# Patient Record
Sex: Male | Born: 1980 | Race: White | Hispanic: No | Marital: Married | Smoking: Never smoker
Health system: Southern US, Community
[De-identification: ages and names within clinical notes are randomized; demographics above are authoritative.]

## PROBLEM LIST (undated history)

## (undated) DIAGNOSIS — F419 Anxiety disorder, unspecified: Secondary | ICD-10-CM

## (undated) DIAGNOSIS — F329 Major depressive disorder, single episode, unspecified: Secondary | ICD-10-CM

## (undated) DIAGNOSIS — F32A Depression, unspecified: Secondary | ICD-10-CM

## (undated) DIAGNOSIS — N200 Calculus of kidney: Secondary | ICD-10-CM

## (undated) DIAGNOSIS — T7840XA Allergy, unspecified, initial encounter: Secondary | ICD-10-CM

## (undated) HISTORY — DX: Anxiety disorder, unspecified: F41.9

## (undated) HISTORY — DX: Allergy, unspecified, initial encounter: T78.40XA

## (undated) HISTORY — PX: VASECTOMY: SHX75

## (undated) HISTORY — DX: Calculus of kidney: N20.0

---

## 2001-10-25 ENCOUNTER — Encounter: Payer: Self-pay | Admitting: Family Medicine

## 2001-10-25 ENCOUNTER — Encounter: Admission: RE | Admit: 2001-10-25 | Discharge: 2001-10-25 | Payer: Self-pay | Admitting: Family Medicine

## 2006-12-03 ENCOUNTER — Encounter: Admission: RE | Admit: 2006-12-03 | Discharge: 2006-12-20 | Payer: Self-pay | Admitting: Family Medicine

## 2012-04-12 ENCOUNTER — Emergency Department: Payer: Self-pay | Admitting: Emergency Medicine

## 2012-04-12 LAB — TROPONIN I
Troponin-I: <0.02 ng/mL
Troponin-I: <0.02 ng/mL

## 2012-04-12 LAB — COMPREHENSIVE METABOLIC PANEL
Albumin: 4.5 g/dL (ref 3.4–5.0)
Alkaline Phosphatase: 79 U/L (ref 50–136)
BUN: 9 mg/dL (ref 7–18)
Chloride: 106 mmol/L (ref 98–107)
Co2: 31 mmol/L (ref 21–32)
Creatinine: 0.9 mg/dL (ref 0.60–1.30)
EGFR (African American): >60
SGOT(AST): 31 U/L (ref 15–37)

## 2012-04-12 LAB — CBC
HCT: 43.4 % (ref 40.0–52.0)
HGB: 15.2 g/dL (ref 13.0–18.0)
Platelet: 188 10*3/uL (ref 150–440)
RDW: 13.5 % (ref 11.5–14.5)

## 2012-04-15 ENCOUNTER — Telehealth: Payer: Self-pay | Admitting: Nurse Practitioner

## 2012-04-15 NOTE — Telephone Encounter (Signed)
Went to ER over the weekend for chest pain and wanted him to follow up for chest pain appt made

## 2012-04-16 ENCOUNTER — Encounter: Payer: Self-pay | Admitting: Nurse Practitioner

## 2012-04-16 ENCOUNTER — Ambulatory Visit (INDEPENDENT_AMBULATORY_CARE_PROVIDER_SITE_OTHER): Payer: BC Managed Care – PPO | Admitting: Nurse Practitioner

## 2012-04-16 VITALS — BP 116/68 | HR 68 | Temp 98.0°F | Ht 69.5 in | Wt 191.0 lb

## 2012-04-16 DIAGNOSIS — F410 Panic disorder [episodic paroxysmal anxiety] without agoraphobia: Secondary | ICD-10-CM

## 2012-04-16 MED ORDER — CITALOPRAM HYDROBROMIDE 20 MG PO TABS: 20.0000 mg | ORAL_TABLET | Freq: Every day | ORAL | Status: DC

## 2012-04-16 NOTE — Progress Notes (Signed)
  Subjective:    Patient ID: Bryan Wilson, male    DOB: [birth year], 32 y.o.   MRN: [id number]  HPIPatient in today for hospital follow-up. Went to ER with SOB and chest pain. Everything was negative including EKG, blood work and Veterinary surgeon. Patient says he gets anxious at work and that is where he was when episode started.    Review of Systems  Constitutional: Negative.   HENT: Negative.   Eyes: Negative.   Respiratory: Negative.   Cardiovascular: Negative.   Gastrointestinal: Negative.   Endocrine: Negative.   Genitourinary: Negative.   Musculoskeletal: Negative.   Neurological: Negative.   Hematological: Negative.   Psychiatric/Behavioral: Negative.        Objective:   Physical Exam  Constitutional: He is oriented to person, place, and time. He appears well-developed and well-nourished.  Cardiovascular: Normal rate, regular rhythm, normal heart sounds and intact distal pulses.   Pulmonary/Chest: Effort normal and breath sounds normal.  Neurological: He is alert and oriented to person, place, and time. He has normal reflexes.  Skin: Skin is warm and dry.  Psychiatric: He has a normal mood and affect. His behavior is normal. Judgment and thought content normal.  BP 116/68  Pulse 68  Temp(Src) 98 F (36.7 C) (Oral)  Ht 5' 9.5" (1.765 m)  Wt 191 lb (86.637 kg)  BMI 27.81 kg/m2         Assessment & Plan:  Panic attacks  Stress management  Eat 3 good meals a day  Celexa as rx- SE reviewed  Mary-Margaret Daphine Deutscher, FNP

## 2012-04-16 NOTE — Patient Instructions (Signed)
Stress Stress-related medical problems are becoming increasingly common. The body has a built-in physical response to stressful situations. Faced with pressure, challenge or danger, we need to react quickly. Our bodies release hormones such as cortisol and adrenaline to help do this. These hormones are part of the "fight or flight" response and affect the metabolic rate, heart rate and blood pressure, resulting in a heightened, stressed state that prepares the body for optimum performance in dealing with a stressful situation. It is likely that early man required these mechanisms to stay alive, but usually modern stresses do not call for this, and the same hormones released in today's world can damage health and reduce coping ability. CAUSES  Pressure to perform at work, at school or in sports.  Threats of physical violence.  Money worries.  Arguments.  Family conflicts.  Divorce or separation from significant other.  Bereavement.  New job or unemployment.  Changes in location.  Alcohol or drug abuse. SOMETIMES, THERE IS NO PARTICULAR REASON FOR DEVELOPING STRESS. Almost all people are at risk of being stressed at some time in their lives. It is important to know that some stress is temporary and some is long term.  Temporary stress will go away when a situation is resolved. Most people can cope with short periods of stress, and it can often be relieved by relaxing, taking a walk, chatting through issues with friends, or having a good night's sleep.  Chronic (long-term, continuous) stress is much harder to deal with. It can be psychologically and emotionally damaging. It can be harmful both for an individual and for friends and family. SYMPTOMS Everyone reacts to stress differently. There are some common effects that help us recognize it. In times of extreme stress, people may:  Shake uncontrollably.  Breathe faster and deeper than normal (hyperventilate).  Vomit.  For people  with asthma, stress can trigger an attack.  For some people, stress may trigger migraine headaches, ulcers, and body pain. PHYSICAL EFFECTS OF STRESS MAY INCLUDE:  Loss of energy.  Skin problems.  Aches and pains resulting from tense muscles, including neck ache, backache and tension headaches.  Increased pain from arthritis and other conditions.  Irregular heart beat (palpitations).  Periods of irritability or anger.  Apathy or depression.  Anxiety (feeling uptight or worrying).  Unusual behavior.  Loss of appetite.  Comfort eating.  Lack of concentration.  Loss of, or decreased, sex-drive.  Increased smoking, drinking, or recreational drug use.  For women, missed periods.  Ulcers, joint pain, and muscle pain. Post-traumatic stress is the stress caused by any serious accident, strong emotional damage, or extremely difficult or violent experience such as rape or war. Post-traumatic stress victims can experience mixtures of emotions such as fear, shame, depression, guilt or anger. It may include recurrent memories or images that may be haunting. These feelings can last for weeks, months or even years after the traumatic event that triggered them. Specialized treatment, possibly with medicines and psychological therapies, is available. If stress is causing physical symptoms, severe distress or making it difficult for you to function as normal, it is worth seeing your caregiver. It is important to remember that although stress is a usual part of life, extreme or prolonged stress can lead to other illnesses that will need treatment. It is better to visit a doctor sooner rather than later. Stress has been linked to the development of high blood pressure and heart disease, as well as insomnia and depression. There is no diagnostic test for   stress since everyone reacts to it differently. But a caregiver will be able to spot the physical symptoms, such  as:  Headaches.  Shingles.  Ulcers. Emotional distress such as intense worry, low mood or irritability should be detected when the doctor asks pertinent questions to identify any underlying problems that might be the cause. In case there are physical reasons for the symptoms, the doctor may also want to do some tests to exclude certain conditions. If you feel that you are suffering from stress, try to identify the aspects of your life that are causing it. Sometimes you may not be able to change or avoid them, but even a small change can have a positive ripple effect. A simple lifestyle change can make all the difference. STRATEGIES THAT CAN HELP DEAL WITH STRESS:  Delegating or sharing responsibilities.  Avoiding confrontations.  Learning to be more assertive.  Regular exercise.  Avoid using alcohol or street drugs to cope.  Eating a healthy, balanced diet, rich in fruit and vegetables and proteins.  Finding humor or absurdity in stressful situations.  Never taking on more than you know you can handle comfortably.  Organizing your time better to get as much done as possible.  Talking to friends or family and sharing your thoughts and fears.  Listening to music or relaxation tapes.  Tensing and then relaxing your muscles, starting at the toes and working up to the head and neck. If you think that you would benefit from help, either in identifying the things that are causing your stress or in learning techniques to help you relax, see a caregiver who is capable of helping you with this. Rather than relying on medications, it is usually better to try and identify the things in your life that are causing stress and try to deal with them. There are many techniques of managing stress including counseling, psychotherapy, aromatherapy, yoga, and exercise. Your caregiver can help you determine what is best for you. Document Released: 03/18/2002 Document Revised: 03/20/2011 Document  Reviewed: 02/12/2007 ExitCare Patient Information 2013 [employer].  

## 2012-05-08 ENCOUNTER — Encounter: Payer: Self-pay | Admitting: *Deleted

## 2012-05-13 ENCOUNTER — Ambulatory Visit (INDEPENDENT_AMBULATORY_CARE_PROVIDER_SITE_OTHER): Payer: BC Managed Care – PPO | Admitting: Nurse Practitioner

## 2012-05-13 ENCOUNTER — Encounter: Payer: Self-pay | Admitting: Nurse Practitioner

## 2012-05-13 VITALS — BP 115/74 | HR 58 | Temp 98.0°F | Wt 183.0 lb

## 2012-05-13 DIAGNOSIS — F329 Major depressive disorder, single episode, unspecified: Secondary | ICD-10-CM

## 2012-05-13 DIAGNOSIS — F3289 Other specified depressive episodes: Secondary | ICD-10-CM

## 2012-05-13 DIAGNOSIS — F32A Depression, unspecified: Secondary | ICD-10-CM

## 2012-05-13 NOTE — Progress Notes (Signed)
  Subjective:    Patient ID: Bryan Wilson, male    DOB: 1980/07/17, 32 y.o.   MRN: [id number]  HPI-Patient here today for follow-up of Celexa- Started 3 weeks ago - Patient says it has helped a lot in keeping him calm and from getting stressed out. Able to stay focused better. Patient denies sexual side effects.     Review of Systems  All other systems reviewed and are negative.       Objective:   Physical Exam  Constitutional: He appears well-developed and well-nourished.  Cardiovascular: Normal rate and normal heart sounds.   Pulmonary/Chest: Effort normal.  Neurological: He is alert.  Skin: Skin is warm.  Psychiatric: He has a normal mood and affect. His behavior is normal. Judgment and thought content normal.    BP 115/74  Pulse 58  Temp(Src) 98 F (36.7 C) (Oral)  Wt 183 lb (83.008 kg)  BMI 26.65 kg/m2       Assessment & Plan:  Depression/anxiety  Continue celexa as RX  Stress management F/U in 6 months  Mary-Margaret Daphine Deutscher, FNP

## 2012-05-13 NOTE — Patient Instructions (Signed)
Anxiety and Panic Attacks  Your caregiver has informed you that you are having an anxiety or panic attack. There may be many forms of this. Most of the time these attacks come suddenly and without warning. They come at any time of day, including periods of sleep, and at any time of life. They may be strong and unexplained. Although panic attacks are very scary, they are physically harmless. Sometimes the cause of your anxiety is not known. Anxiety is a protective mechanism of the body in its fight or flight mechanism. Most of these perceived danger situations are actually nonphysical situations (such as anxiety over losing a job).  CAUSES    The causes of an anxiety or panic attack are many. Panic attacks may occur in otherwise healthy people given a certain set of circumstances. There may be a genetic cause for panic attacks. Some medications may also have anxiety as a side effect.  SYMPTOMS    Some of the most common feelings are:   Intense terror.   Dizziness, feeling faint.   Hot and cold flashes.   Fear of going crazy.   Feelings that nothing is real.   Sweating.   Shaking.   Chest pain or a fast heartbeat (palpitations).   Smothering, choking sensations.   Feelings of impending doom and that death is near.   Tingling of extremities, this may be from over-breathing.   Altered reality (derealization).   Being detached from yourself (depersonalization).  Several symptoms can be present to make up anxiety or panic attacks.  DIAGNOSIS    The evaluation by your caregiver will depend on the type of symptoms you are experiencing. The diagnosis of anxiety or panic attack is made when no physical illness can be determined to be a cause of the symptoms.  TREATMENT    Treatment to prevent anxiety and panic attacks may include:   Avoidance of circumstances that cause anxiety.   Reassurance and relaxation.   Regular exercise.   Relaxation therapies, such as yoga.    Psychotherapy with a psychiatrist or therapist.   Avoidance of caffeine, alcohol and illegal drugs.   Prescribed medication.  SEEK IMMEDIATE MEDICAL CARE IF:     You experience panic attack symptoms that are different than your usual symptoms.   You have any worsening or concerning symptoms.  Document Released: 12/26/2004 Document Revised: 03/20/2011 Document Reviewed: 04/29/2009  ExitCare Patient Information 2013 [employer].    Depression, Adult  Depression refers to feeling sad, low, down in the dumps, blue, gloomy, or empty. In general, there are two kinds of depression:  1. Depression that we all experience from time to time because of upsetting life experiences, including the loss of a job or the ending of a relationship (normal sadness or normal grief). This kind of depression is considered normal, is short lived, and resolves within a few days to 2 weeks. (Depression experienced after the loss of a loved one is called bereavement. Bereavement often lasts longer than 2 weeks but normally gets better with time.)  2. Clinical depression, which lasts longer than normal sadness or normal grief or interferes with your ability to function at home, at work, and in school. It also interferes with your personal relationships. It affects almost every aspect of your life. Clinical depression is an illness.  Symptoms of depression also can be caused by conditions other than normal sadness and grief or clinical depression. Examples of these conditions are listed as follows:   Physical illness Some physical   illnesses, including underactive thyroid gland (hypothyroidism), severe anemia, specific types of cancer, diabetes, uncontrolled seizures, heart and lung problems, strokes, and chronic pain are commonly associated with symptoms of depression.   Side effects of some prescription medicine In some people, certain types of prescription medicine can cause symptoms of depression.    Substance abuse Abuse of alcohol and illicit drugs can cause symptoms of depression.  SYMPTOMS  Symptoms of normal sadness and normal grief include the following:   Feeling sad or crying for short periods of time.   Not caring about anything (apathy).   Difficulty sleeping or sleeping too much.   No longer able to enjoy the things you used to enjoy.   Desire to be by oneself all the time (social isolation).   Lack of energy or motivation.   Difficulty concentrating or remembering.   Change in appetite or weight.   Restlessness or agitation.  Symptoms of clinical depression include the same symptoms of normal sadness or normal grief and also the following symptoms:   Feeling sad or crying all the time.   Feelings of guilt or worthlessness.   Feelings of hopelessness or helplessness.   Thoughts of suicide or the desire to harm yourself (suicidal ideation).   Loss of touch with reality (psychotic symptoms). Seeing or hearing things that are not real (hallucinations) or having false beliefs about your life or the people around you (delusions and paranoia).  DIAGNOSIS    The diagnosis of clinical depression usually is based on the severity and duration of the symptoms. Your caregiver also will ask you questions about your medical history and substance use to find out if physical illness, use of prescription medicine, or substance abuse is causing your depression. Your caregiver also may order blood tests.  TREATMENT    Typically, normal sadness and normal grief do not require treatment. However, sometimes antidepressant medicine is prescribed for bereavement to ease the depressive symptoms until they resolve.  The treatment for clinical depression depends on the severity of your symptoms but typically includes antidepressant medicine, counseling with a mental health professional, or a combination of both. Your caregiver will help to determine what treatment is best for you.   Depression caused by physical illness usually goes away with appropriate medical treatment of the illness. If prescription medicine is causing depression, talk with your caregiver about stopping the medicine, decreasing the dose, or substituting another medicine.  Depression caused by abuse of alcohol or illicit drugs abuse goes away with abstinence from these substances. Some adults need professional help in order to stop drinking or using drugs.  SEEK IMMEDIATE CARE IF:   You have thoughts about hurting yourself or others.   You lose touch with reality (have psychotic symptoms).   You are taking medicine for depression and have a serious side effect.  FOR MORE INFORMATION  National Alliance on Mental Illness: www.nami.org   National Institute of Mental Health: www.nimh.nih.gov   Document Released: 12/24/1999 Document Revised: 06/27/2011 Document Reviewed: 03/27/2011  [employer] Patient Information 2013 [employer].

## 2012-05-25 ENCOUNTER — Telehealth: Payer: Self-pay | Admitting: *Deleted

## 2012-05-25 NOTE — Telephone Encounter (Signed)
  Husband has been out on vacation and returning to work today.  His wife is concerned that he seems frustrated and anxious.  He has not been sleeping well and says he has had some crazy thoughts and feels depressed.  She is unsure if the celexa is working well enough.  Should  celexa be increased or should we consider a change? Please call on Monday to advise her.

## 2012-05-27 MED ORDER — CITALOPRAM HYDROBROMIDE 40 MG PO TABS: 40.0000 mg | ORAL_TABLET | Freq: Every day | ORAL | Status: DC

## 2012-05-27 NOTE — Telephone Encounter (Signed)
Pt given appt for 5/20 at 8:00 with mmm and aware to stop the celexa.

## 2012-05-27 NOTE — Telephone Encounter (Signed)
Pt is having some suicidal thoughts and has gotten worse since starting the medicine.

## 2012-05-27 NOTE — Telephone Encounter (Signed)
Increased celexa to 40mg  rx was sent to pharmacy

## 2012-05-27 NOTE — Telephone Encounter (Signed)
Patient really NTBS to discuss- Stop Celexa!!!

## 2012-05-28 ENCOUNTER — Ambulatory Visit (INDEPENDENT_AMBULATORY_CARE_PROVIDER_SITE_OTHER): Payer: BC Managed Care – PPO | Admitting: Nurse Practitioner

## 2012-05-28 ENCOUNTER — Encounter: Payer: Self-pay | Admitting: Nurse Practitioner

## 2012-05-28 VITALS — BP 113/68 | HR 62 | Temp 98.0°F | Ht 69.0 in | Wt 191.0 lb

## 2012-05-28 DIAGNOSIS — F329 Major depressive disorder, single episode, unspecified: Secondary | ICD-10-CM

## 2012-05-28 DIAGNOSIS — F32A Depression, unspecified: Secondary | ICD-10-CM

## 2012-05-28 MED ORDER — VILAZODONE HCL 40 MG PO TABS: 40.0000 mg | ORAL_TABLET | Freq: Every day | ORAL | Status: DC

## 2012-05-28 NOTE — Patient Instructions (Signed)

## 2012-05-28 NOTE — Progress Notes (Signed)
  Subjective:    Patient ID: Bryan Wilson, male    DOB: [birth year], 32 y.o.   MRN: [id number]  HPI- Patient was put on Celexa about 1 month ago- Helped some but started causing suicidal thoughts. Patient was on vacation for a week and just went back to worse and anxiety was worse. Patient hasn't taken celexa since Saturday.    Review of Systems  All other systems reviewed and are negative.       Objective:   Physical Exam  Constitutional: He appears well-developed and well-nourished.  Cardiovascular: Normal rate and normal heart sounds.   Pulmonary/Chest: Effort normal and breath sounds normal.  Skin: Skin is warm.  Psychiatric: He has a normal mood and affect. His behavior is normal. Judgment and thought content normal.    BP 113/68  Pulse 62  Temp(Src) 98 F (36.7 C) (Oral)  Ht 5\' 9"  (1.753 m)  Wt 191 lb (86.637 kg)  BMI 28.19 kg/m2       Assessment & Plan:  1. Depression Stress management exercise - Vilazodone HCl (VIIBRYD) 40 MG TABS; Take 1 tablet (40 mg total) by mouth daily.  Dispense: 30 tablet; Refill: 0  Mary-Margaret Daphine Deutscher, FNP

## 2012-06-17 ENCOUNTER — Telehealth: Payer: Self-pay | Admitting: Nurse Practitioner

## 2012-06-17 ENCOUNTER — Encounter (HOSPITAL_COMMUNITY): Payer: Self-pay

## 2012-06-17 ENCOUNTER — Ambulatory Visit (INDEPENDENT_AMBULATORY_CARE_PROVIDER_SITE_OTHER): Payer: BC Managed Care – PPO | Admitting: Nurse Practitioner

## 2012-06-17 ENCOUNTER — Emergency Department (HOSPITAL_COMMUNITY)
Admission: EM | Admit: 2012-06-17 | Discharge: 2012-06-17 | Disposition: A | Discharge status: Home / Self Care | Payer: BC Managed Care – PPO | Attending: Emergency Medicine | Admitting: Emergency Medicine

## 2012-06-17 ENCOUNTER — Encounter: Payer: Self-pay | Admitting: Nurse Practitioner

## 2012-06-17 VITALS — BP 117/65 | HR 72 | Temp 97.8°F

## 2012-06-17 DIAGNOSIS — F3289 Other specified depressive episodes: Secondary | ICD-10-CM | POA: Insufficient documentation

## 2012-06-17 DIAGNOSIS — Z79899 Other long term (current) drug therapy: Secondary | ICD-10-CM | POA: Insufficient documentation

## 2012-06-17 DIAGNOSIS — F329 Major depressive disorder, single episode, unspecified: Secondary | ICD-10-CM | POA: Insufficient documentation

## 2012-06-17 DIAGNOSIS — F32A Depression, unspecified: Secondary | ICD-10-CM

## 2012-06-17 DIAGNOSIS — F411 Generalized anxiety disorder: Secondary | ICD-10-CM | POA: Insufficient documentation

## 2012-06-17 DIAGNOSIS — R45851 Suicidal ideations: Secondary | ICD-10-CM

## 2012-06-17 HISTORY — DX: Major depressive disorder, single episode, unspecified: F32.9

## 2012-06-17 HISTORY — DX: Depression, unspecified: F32.A

## 2012-06-17 LAB — BASIC METABOLIC PANEL
CO2: 32 mEq/L (ref 19–32)
Calcium: 9.9 mg/dL (ref 8.4–10.5)
Creatinine, Ser: 0.99 mg/dL (ref 0.50–1.35)
GFR calc Af Amer: >90 mL/min (ref >90)
GFR calc non Af Amer: >90 mL/min (ref >90)
Sodium: 144 mEq/L (ref 135–145)

## 2012-06-17 LAB — RAPID URINE DRUG SCREEN, HOSP PERFORMED
Amphetamines: NOT DETECTED
Opiates: NOT DETECTED

## 2012-06-17 LAB — CBC WITH DIFFERENTIAL/PLATELET
Basophils Absolute: 0 10*3/uL (ref 0.0–0.1)
Basophils Relative: 0 % (ref 0–1)
Eosinophils Relative: 3 % (ref 0–5)
Lymphocytes Relative: 22 % (ref 12–46)
MCHC: 35.1 g/dL (ref 30.0–36.0)
MCV: 83.1 fL (ref 78.0–100.0)
Platelets: 212 10*3/uL (ref 150–400)
RDW: 13.4 % (ref 11.5–15.5)
WBC: 4.8 10*3/uL (ref 4.0–10.5)

## 2012-06-17 LAB — ETHANOL: Alcohol, Ethyl (B): <11 mg/dL (ref 0–11)

## 2012-06-17 MED ORDER — ONDANSETRON HCL 4 MG PO TABS: 4.0000 mg | ORAL_TABLET | Freq: Three times a day (TID) | ORAL | Status: DC | PRN

## 2012-06-17 MED ORDER — HYDROXYZINE HCL 25 MG PO TABS: 25.0000 mg | ORAL_TABLET | Freq: Two times a day (BID) | ORAL | Status: DC | PRN

## 2012-06-17 MED ORDER — BUPROPION HCL 100 MG PO TABS: 100.0000 mg | ORAL_TABLET | Freq: Every morning | ORAL | Status: DC

## 2012-06-17 MED ORDER — ACETAMINOPHEN 325 MG PO TABS: 650.0000 mg | ORAL_TABLET | ORAL | Status: DC | PRN

## 2012-06-17 NOTE — Telephone Encounter (Signed)
appt made for this am with mmm

## 2012-06-17 NOTE — ED Notes (Signed)
Pt undressed self, belongings bagged and placed into lock-up.  Pt re-wanded by security.  Pt has male visitor at bedside.

## 2012-06-17 NOTE — Progress Notes (Signed)
  Subjective:    Patient ID: Bryan Wilson, male    DOB: 13-Apr-1980, 32 y.o.   MRN: [id number]  HPI  Patient was seen 2 weeks ago with depression and anxiety- Was started on viibyrd- patient says he is unchanged- Only difference he can tell is that he is sleeping better. Tried celexa prior to viibyrd which did nothing. Issues seem to be mainly at work. Patient says that he is having suicidal thoughts- Patient said that if he would have had a gun in the car this morning he would have shot hisself- said he was on his way to work when he started feeling this way.    Review of Systems  Psychiatric/Behavioral: Positive for suicidal ideas, behavioral problems and sleep disturbance.  All other systems reviewed and are negative.       Objective:   Physical Exam  Constitutional: He appears well-developed and well-nourished.  Cardiovascular: Normal rate and normal heart sounds.   Pulmonary/Chest: Effort normal and breath sounds normal.  Psychiatric: His speech is normal and behavior is normal. Judgment normal. His affect is labile. Cognition and memory are normal. He exhibits a depressed mood. He expresses suicidal ideation. He expresses no suicidal plans.          Assessment & Plan:  Depression with suicidal thoughts - To [facility] for intake Wife to take Mary-Margaret Daphine Deutscher, FNP

## 2012-06-17 NOTE — ED Notes (Signed)
MD at bedside to discuss telepsych results

## 2012-06-17 NOTE — ED Provider Notes (Signed)
Received at change of shift with Telepsych consult pending. Seen in PMD's ofc today for worsening depression and voiced vague SI. Recently changed celexa to vybrid.  Pt feels his depression is "worse" since being started on Vybrid.  Telepsych Dr. Leretha Pol has eval pt: requests to d/c vybrid as pt may be sensitive to SSRI; start wellbutrin 100mg  PO qam and vistaril 25mg  PO BID prn anxiety, f/u mental health provider as an outpt. She feels there is no psych admission criteria at this time. Dx and testing, as well as Telepsych MD eval, d/w pt and family.  Questions answered.  Verb understanding, agreeable to d/c home with outpt f/u.   Laray Anger, DO 06/17/12 2123

## 2012-06-17 NOTE — ED Notes (Signed)
Security wanding pt, wife with pt in FT waiting room, Charge RN aware.

## 2012-06-17 NOTE — Patient Instructions (Signed)

## 2012-06-17 NOTE — ED Provider Notes (Signed)
History     CSN: [id number]  Arrival date & time 06/17/12  1301   First MD Initiated Contact with Patient 06/17/12 1417      Chief Complaint  Patient presents with  . V70.1    (Consider location/radiation/quality/duration/timing/severity/associated sxs/prior treatment) HPI Comments: From PCP's office with worsening depression. States it has been a problem for the past several months. He had been taking celexa which was switched to viibryd.  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 Denies plan or SI at other times.  No HI or hallucinations. No drug use, intermittent alcohol use. Good PO intake and urine output. No somatic complaints. 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 Wife states she feels safe.  The history is provided by the patient.    Past Medical History  Diagnosis Date  . Allergy   . Anxiety   . Depression     History reviewed. No pertinent past surgical history.  Family History  Problem Relation Age of Onset  . Kidney Stones Father     History  Substance Use Topics  . Smoking status: Never Smoker   . Smokeless tobacco: Not on file  . Alcohol Use: Yes     Comment: occ      Review of Systems  Constitutional: Negative for fever, activity change and appetite change.  Respiratory: Negative for cough, chest tightness and shortness of breath.   Gastrointestinal: Negative for nausea, vomiting and abdominal pain.  Genitourinary: Negative for dysuria and hematuria.  Neurological: Negative for dizziness and headaches.  Psychiatric/Behavioral: Positive for suicidal ideas and dysphoric mood. The patient is not nervous/anxious.     Allergies  Review of patient's allergies indicates no known allergies.  Home Medications   Current Outpatient Rx  Name  Route  Sig  Dispense  Refill  . fexofenadine (ALLEGRA) 180 MG tablet   Oral   Take 180 mg by mouth daily.         . Vilazodone HCl (VIIBRYD) 40 MG  TABS   Oral   Take 1 tablet (40 mg total) by mouth daily.   30 tablet   0     BP 130/72  Pulse 91  Temp(Src) 98.2 F (36.8 C) (Oral)  Resp 16  Ht 5\' 9"  (1.753 m)  Wt 190 lb (86.183 kg)  BMI 28.05 kg/m2  SpO2 100%  Physical Exam  Constitutional: He is oriented to person, place, and time. He appears well-developed and well-nourished. No distress.  HENT:  Head: Normocephalic and atraumatic.  Mouth/Throat: Oropharynx is clear and moist. No oropharyngeal exudate.  Eyes: Conjunctivae and EOM are normal. Pupils are equal, round, and reactive to light.  Neck: Normal range of motion. Neck supple.  Cardiovascular: Normal rate, regular rhythm and normal heart sounds.   No murmur heard. Pulmonary/Chest: Effort normal and breath sounds normal. No respiratory distress.  Abdominal: Soft. There is no tenderness. There is no rebound and no guarding.  Musculoskeletal: Normal range of motion. He exhibits no edema and no tenderness.  Neurological: He is alert and oriented to person, place, and time. No cranial nerve deficit. He exhibits normal muscle tone. Coordination normal.  Skin: Skin is warm.  Psychiatric: He has a normal mood and affect.    ED Course  Procedures (including critical care time)  Labs Reviewed  CBC WITH DIFFERENTIAL  BASIC METABOLIC PANEL  ETHANOL  URINE RAPID DRUG SCREEN (HOSP PERFORMED)   No results found.   1.  Depression       MDM  Worsening depression, vague SI at times without plan.  Calm, cooperative, vitals stable, no distress.  Screening labs, telepsych consult.        Glynn Octave, MD 06/17/12 1745

## 2012-06-17 NOTE — ED Notes (Signed)
Pt alert & oriented x4, stable gait. Patient given discharge instructions, paperwork & prescription(s). Patient  instructed to stop at the registration desk to finish any additional paperwork. Patient verbalized understanding. Pt left department w/ no further questions. 

## 2012-06-17 NOTE — ED Notes (Signed)
Pt reports has had depression and has been having SI for the past few months.  Denies a plan, denies any HI.  Says has tried a couple of different medications but they aren't helping.

## 2012-06-18 ENCOUNTER — Telehealth: Payer: Self-pay | Admitting: *Deleted

## 2012-06-18 ENCOUNTER — Encounter: Payer: Self-pay | Admitting: Nurse Practitioner

## 2012-06-18 NOTE — Telephone Encounter (Signed)
Wife aware to call [facility] counseling center to initiate first visit

## 2012-06-18 NOTE — Telephone Encounter (Signed)
Thank you :)

## 2012-06-18 NOTE — Telephone Encounter (Signed)
Can come and pick up a list of counselors. I really like [facility] in [place]. Tell patient to sign patient up for my chart so that it is easier to reach me.

## 2012-06-18 NOTE — Telephone Encounter (Signed)
FYI- MMM  Was at hosp to midnight- he spoke to a psychiatrist and she took him off viibryd and started vistaril prn and qd wellbutrin. They recommended him see [facility] next week, but they want to know if their is anywhere else that you recommend. He feels some better today. Just let Morrie Sheldon (wife) know what you think.

## 2012-10-18 ENCOUNTER — Ambulatory Visit: Payer: BC Managed Care – PPO | Admitting: *Deleted

## 2012-10-18 DIAGNOSIS — Z23 Encounter for immunization: Secondary | ICD-10-CM

## 2012-10-18 NOTE — Progress Notes (Signed)
Pt tolerated well

## 2013-03-28 ENCOUNTER — Encounter: Payer: Self-pay | Admitting: Family Medicine

## 2013-03-28 ENCOUNTER — Ambulatory Visit (INDEPENDENT_AMBULATORY_CARE_PROVIDER_SITE_OTHER): Payer: PRIVATE HEALTH INSURANCE | Admitting: Family Medicine

## 2013-03-28 ENCOUNTER — Telehealth: Payer: Self-pay | Admitting: Nurse Practitioner

## 2013-03-28 VITALS — BP 133/76 | HR 74 | Temp 98.4°F | Ht 69.0 in | Wt 202.6 lb

## 2013-03-28 DIAGNOSIS — J329 Chronic sinusitis, unspecified: Secondary | ICD-10-CM

## 2013-03-28 MED ORDER — AMOXICILLIN 875 MG PO TABS: 875.0000 mg | ORAL_TABLET | Freq: Two times a day (BID) | ORAL | Status: DC

## 2013-03-28 MED ORDER — METHYLPREDNISOLONE ACETATE 80 MG/ML IJ SUSP: 60.0000 mg | Freq: Once | INTRAMUSCULAR | Status: DC

## 2013-03-28 MED ORDER — METHYLPREDNISOLONE ACETATE 80 MG/ML IJ SUSP
80.0000 mg | Freq: Once | INTRAMUSCULAR | Status: AC
  Administered 2013-03-28: 80 mg via INTRAMUSCULAR

## 2013-03-28 NOTE — Progress Notes (Signed)
   Subjective:    Patient ID: Bryan Wilson, male    DOB: 1980-03-23, 33 y.o.   MRN: [id number]  HPI This 33 y.o. male presents for evaluation of uri and sinus congestion.   Review of Systems No chest pain, SOB, HA, dizziness, vision change, N/V, diarrhea, constipation, dysuria, urinary urgency or frequency, myalgias, arthralgias or rash.     Objective:   Physical Exam Vital signs noted  Well developed well nourished male.  HEENT - Head atraumatic Normocephalic                Eyes - PERRLA, Conjuctiva - clear Sclera- Clear EOMI                Ears - EAC's Wnl TM's Wnl Gross Hearing WNL                Throat - oropharanx wnl Respiratory - Lungs CTA bilateral Cardiac - RRR S1 and S2 without murmur GI - Abdomen soft Nontender and bowel sounds active x 4 Extremities - No edema. Neuro - Grossly intact.       Assessment & Plan:  Sinusitis - Plan: methylPREDNISolone acetate (DEPO-MEDROL) injection 60 mg, amoxicillin (AMOXIL) 875 MG tablet, methylPREDNISolone acetate (DEPO-MEDROL) injection 80 mg Push po fluids, rest, tylenol and motrin otc prn as directed for fever, arthralgias, and myalgias.  Follow up prn if sx's continue or persist.  Deatra CanterWilliam J Oxford FNP

## 2013-03-28 NOTE — Telephone Encounter (Signed)
Wife aware of 1115 appt today

## 2013-06-09 DIAGNOSIS — N200 Calculus of kidney: Secondary | ICD-10-CM

## 2013-06-09 HISTORY — DX: Calculus of kidney: N20.0

## 2013-10-22 ENCOUNTER — Ambulatory Visit (INDEPENDENT_AMBULATORY_CARE_PROVIDER_SITE_OTHER): Payer: BC Managed Care – PPO | Admitting: *Deleted

## 2013-10-22 DIAGNOSIS — Z23 Encounter for immunization: Secondary | ICD-10-CM

## 2013-10-22 NOTE — Progress Notes (Signed)
Patient ID: Danne BaxterMatthew K Brand, male   DOB: [birth year], 33 y.o.   MRN: [id number] tdap given and tolerated well

## 2013-10-22 NOTE — Patient Instructions (Signed)

## 2014-02-17 ENCOUNTER — Encounter (INDEPENDENT_AMBULATORY_CARE_PROVIDER_SITE_OTHER): Payer: BLUE CROSS/BLUE SHIELD | Admitting: Urology

## 2014-02-17 DIAGNOSIS — Z302 Encounter for sterilization: Secondary | ICD-10-CM

## 2014-05-29 ENCOUNTER — Encounter: Payer: Self-pay | Admitting: Family Medicine

## 2014-06-05 ENCOUNTER — Ambulatory Visit (INDEPENDENT_AMBULATORY_CARE_PROVIDER_SITE_OTHER): Payer: BLUE CROSS/BLUE SHIELD | Admitting: Family Medicine

## 2014-06-05 ENCOUNTER — Encounter: Payer: Self-pay | Admitting: Family Medicine

## 2014-06-05 VITALS — BP 110/66 | HR 73 | Temp 98.3°F | Ht 69.0 in | Wt 186.0 lb

## 2014-06-05 DIAGNOSIS — Z0000 Encounter for general adult medical examination without abnormal findings: Secondary | ICD-10-CM

## 2014-06-05 NOTE — Progress Notes (Signed)
Subjective:  Patient ID: Bryan Wilson, male    DOB: 1980/01/16  Age: 34 y.o. MRN: [id number]  CC: Annual Exam   HPI Bryan Wilson presents for complete physical required for his work. Briefly he states that he changed jobs due to pressure put on admit his previous position and that completely took away the situational depression he had previously kidney stone reported since his last physical resolved promptly with no residual problems. He notes family history of nephrolithiasis in his father. Allergies are under good control with over-the-counter Allegra. He had a vasa ectomy 2 months ago. He has a 71-year-old son and a 42-month-old daughter.  History Bryan Wilson has a past medical history of Allergy; Anxiety; Depression; and Nephrolithiasis (06/2013).   He has past surgical history that includes Vasectomy.   His family history includes Kidney Stones in his father. There is no history of Alcohol abuse, Asthma, Heart disease, Hyperlipidemia, Hypertension, or Cancer.He reports that he has never smoked. He does not have any smokeless tobacco history on file. He reports that he drinks about 1.2 - 1.8 oz of alcohol per week. He reports that he does not use illicit drugs.  Outpatient Prescriptions Prior to Visit  Medication Sig Dispense Refill  . fexofenadine (ALLEGRA) 180 MG tablet Take 180 mg by mouth daily.    Marland Kitchen amoxicillin (AMOXIL) 875 MG tablet Take 1 tablet (875 mg total) by mouth 2 (two) times daily. (Patient not taking: Reported on 06/05/2014) 20 tablet 0   Facility-Administered Medications Prior to Visit  Medication Dose Route Frequency Provider Last Rate Last Dose  . methylPREDNISolone acetate (DEPO-MEDROL) injection 60 mg  60 mg Intramuscular Once Deatra Canter, FNP   60 mg at 03/28/13 1215    ROS Review of Systems  Constitutional: Negative for fever, chills, diaphoresis, activity change, appetite change, fatigue and unexpected weight change.  HENT: Negative for congestion, ear  pain, hearing loss, postnasal drip, rhinorrhea, sore throat, tinnitus and trouble swallowing.   Eyes: Negative for photophobia, pain, discharge and redness.  Respiratory: Negative for apnea, cough, choking, chest tightness, shortness of breath, wheezing and stridor.   Cardiovascular: Negative for chest pain, palpitations and leg swelling.  Gastrointestinal: Negative for nausea, vomiting, abdominal pain, diarrhea, constipation, blood in stool and abdominal distention.  Endocrine: Negative for cold intolerance, heat intolerance, polydipsia, polyphagia and polyuria.  Genitourinary: Negative for dysuria, urgency, frequency, hematuria, flank pain, enuresis, difficulty urinating and genital sores.  Musculoskeletal: Negative for joint swelling and arthralgias.  Skin: Negative for color change, rash and wound.  Allergic/Immunologic: Negative for immunocompromised state.  Neurological: Negative for dizziness, tremors, seizures, syncope, facial asymmetry, speech difficulty, weakness, light-headedness, numbness and headaches.  Hematological: Does not bruise/bleed easily.  Psychiatric/Behavioral: Negative for suicidal ideas, hallucinations, behavioral problems, confusion, sleep disturbance, dysphoric mood, decreased concentration and agitation. The patient is not nervous/anxious and is not hyperactive.     Objective:  BP 110/66 mmHg  Pulse 73  Temp(Src) 98.3 F (36.8 C) (Oral)  Ht  (1.753 m)  Wt 186 lb (84.369 kg)  BMI 27.45 kg/m2  BP Readings from Last 3 Encounters:  06/05/14 110/66  03/28/13 133/76  06/17/12 115/73    Wt Readings from Last 3 Encounters:  06/05/14 186 lb (84.369 kg)  03/28/13 202 lb 9.6 oz (91.899 kg)  06/17/12 190 lb (86.183 kg)     Physical Exam  Constitutional: He is oriented to person, place, and time. He appears well-developed and well-nourished.  HENT:  Head: Normocephalic and atraumatic.  Mouth/Throat: Oropharynx is clear and moist.  Eyes: EOM are normal.  Pupils are equal, round, and reactive to light.  Neck: Normal range of motion. No tracheal deviation present. No thyromegaly present.  Cardiovascular: Normal rate, regular rhythm and normal heart sounds.  Exam reveals no gallop and no friction rub.   No murmur heard. Pulmonary/Chest: Breath sounds normal. He has no wheezes. He has no rales.  Abdominal: Soft. He exhibits no mass. There is no tenderness.  Genitourinary: Penis normal. No penile tenderness.  Musculoskeletal: Normal range of motion. He exhibits no edema or tenderness.  Neurological: He is alert and oriented to person, place, and time. He displays normal reflexes. No cranial nerve deficit. He exhibits normal muscle tone. Coordination normal.  Skin: Skin is warm and dry.  Psychiatric: He has a normal mood and affect.    No results found for: HGBA1C  Lab Results  Component Value Date   WBC 4.8 06/17/2012   HGB 14.8 06/17/2012   HCT 42.2 06/17/2012   PLT 212 06/17/2012   GLUCOSE 77 06/17/2012   ALT 20 04/12/2012   AST 31 04/12/2012   NA 144 06/17/2012   K 3.7 06/17/2012   CL 102 06/17/2012   CREATININE 0.99 06/17/2012   BUN 10 06/17/2012   CO2 32 06/17/2012    No results found.  Assessment & Plan:   Bryan Wilson was seen today for annual exam.  Diagnoses and all orders for this visit:  Wellness examination   I have discontinued Mr. Rath's amoxicillin. I am also having him maintain his fexofenadine. We will continue to administer methylPREDNISolone acetate.  No orders of the defined types were placed in this encounter.     Follow-up: Return in about 1 year (around 06/05/2015).  Mechele ClaudeWarren Trason Shifflet, M.D.

## 2014-07-07 ENCOUNTER — Ambulatory Visit: Payer: BLUE CROSS/BLUE SHIELD | Admitting: Urology

## 2015-03-09 ENCOUNTER — Encounter: Payer: Self-pay | Admitting: Family Medicine

## 2015-03-09 ENCOUNTER — Ambulatory Visit (INDEPENDENT_AMBULATORY_CARE_PROVIDER_SITE_OTHER): Payer: BLUE CROSS/BLUE SHIELD | Admitting: Family Medicine

## 2015-03-09 VITALS — BP 113/66 | HR 68 | Temp 98.6°F | Ht 69.0 in | Wt 185.0 lb

## 2015-03-09 DIAGNOSIS — G4700 Insomnia, unspecified: Secondary | ICD-10-CM | POA: Diagnosis not present

## 2015-03-09 DIAGNOSIS — R5382 Chronic fatigue, unspecified: Secondary | ICD-10-CM | POA: Diagnosis not present

## 2015-03-09 DIAGNOSIS — F32A Depression, unspecified: Secondary | ICD-10-CM

## 2015-03-09 DIAGNOSIS — R29898 Other symptoms and signs involving the musculoskeletal system: Secondary | ICD-10-CM | POA: Diagnosis not present

## 2015-03-09 DIAGNOSIS — F419 Anxiety disorder, unspecified: Secondary | ICD-10-CM

## 2015-03-09 DIAGNOSIS — F418 Other specified anxiety disorders: Secondary | ICD-10-CM | POA: Diagnosis not present

## 2015-03-09 DIAGNOSIS — R5383 Other fatigue: Secondary | ICD-10-CM | POA: Insufficient documentation

## 2015-03-09 DIAGNOSIS — F329 Major depressive disorder, single episode, unspecified: Secondary | ICD-10-CM

## 2015-03-09 MED ORDER — TRAZODONE HCL 150 MG PO TABS: 150.0000 mg | ORAL_TABLET | Freq: Every day | ORAL | Status: DC

## 2015-03-09 NOTE — Progress Notes (Signed)
Subjective:  Patient ID: Bryan Wilson, male    DOB: [birth year]  Age: 35 y.o. MRN: [id number]  CC: Fatigue; Weight Loss; and Insomnia   HPI Bryan Wilson presents for "tired all the time. Not sleeping well. Last night awake at 2:30. Fell asleep again sometime after 4 AM. Drinking water instead of 3-4 sodas a day. Has no patience. Getting pissed off for no reason. Bryan Wilson is like that. States he can relax easily. However if he wakes up in the night he cannot get to sleep. He says it's really hard to get a full night's sleep. He historically exercised a lot growing up and it is early years now he has backed off on exercise and. He says he actually gets none. His wife is concerned that he is irritable. She wants him to have a full workup for that including thyroid. He says he drinks about 6 bottles of water a day but he does not have polyuria. He is not excessively thirsty he just drinks it for health instead of sodas he used to drink.   Depression screen Bryan Wilson 2/9 03/09/2015 06/05/2014 03/28/2013  Decreased Interest 0 0 0  Down, Depressed, Hopeless 0 0 0  PHQ - 2 Score 0 0 0     GAD 7 : Generalized Anxiety Score 03/09/2015  Nervous, Anxious, on Edge 0  Control/stop worrying 0  Worry too much - different things 0  Trouble relaxing 0  Restless 0  Easily annoyed or irritable 1  Afraid - awful might happen 0  Total GAD 7 Score 1  Anxiety Difficulty Not difficult at all     Dog barking at night at neighvor's. Nose pouring. Taking allegra daily for allergy.    History Bryan Wilson has a past medical history of Allergy; Anxiety; Depression; and Nephrolithiasis (06/2013).   He has past surgical history that includes Vasectomy.   His family history includes Kidney Stones in his father. There is no history of Alcohol abuse, Asthma, Heart disease, Hyperlipidemia, Hypertension, or Cancer.He reports that he has never smoked. He does not have any smokeless tobacco history on file. He reports that  he drinks about 1.2 - 1.8 oz of alcohol per week. He reports that he does not use illicit drugs.    ROS Review of Systems  Constitutional: Negative for fever, chills, diaphoresis and unexpected weight change.  HENT: Negative for congestion, hearing loss, rhinorrhea and sore throat.   Eyes: Negative for visual disturbance.  Respiratory: Negative for cough and shortness of breath.   Cardiovascular: Negative for chest pain.  Gastrointestinal: Negative for abdominal pain, diarrhea and constipation.  Genitourinary: Negative for dysuria and flank pain.  Musculoskeletal: Negative for joint swelling and arthralgias.  Skin: Negative for rash.  Neurological: Negative for dizziness and headaches.  Psychiatric/Behavioral: Negative for sleep disturbance and dysphoric mood.    Objective:  BP 113/66 mmHg  Pulse 68  Temp(Src) 98.6 F (37 C) (Oral)  Ht '5\' 9"'  (1.753 m)  Wt 185 lb (83.915 kg)  BMI 27.31 kg/m2  SpO2 100%  BP Readings from Last 3 Encounters:  03/09/15 113/66  06/05/14 110/66  03/28/13 133/76    Wt Readings from Last 3 Encounters:  03/09/15 185 lb (83.915 kg)  06/05/14 186 lb (84.369 kg)  03/28/13 202 lb 9.6 oz (91.899 kg)     Physical Exam  Constitutional: He is oriented to person, place, and time. He appears well-developed and well-nourished. No distress.  HENT:  Head: Normocephalic and atraumatic.  Right Ear: External  ear normal.  Left Ear: External ear normal.  Nose: Nose normal.  Mouth/Throat: Oropharynx is clear and moist.  Eyes: Conjunctivae and EOM are normal. Pupils are equal, round, and reactive to light.  Neck: Normal range of motion. Neck supple. No thyromegaly present.  Cardiovascular: Normal rate, regular rhythm and normal heart sounds.   No murmur heard. Pulmonary/Chest: Effort normal and breath sounds normal. No respiratory distress. He has no wheezes. He has no rales.  Abdominal: Soft. Bowel sounds are normal. He exhibits no distension. There is no  tenderness.  Lymphadenopathy:    He has no cervical adenopathy.  Neurological: He is alert and oriented to person, place, and time. He has normal reflexes.  Skin: Skin is warm and dry.  Psychiatric: He has a normal mood and affect. His behavior is normal. Judgment and thought content normal.     Lab Results  Component Value Date   WBC 4.8 06/17/2012   HGB 14.8 06/17/2012   HCT 42.2 06/17/2012   PLT 212 06/17/2012   GLUCOSE 77 06/17/2012   ALT 20 04/12/2012   AST 31 04/12/2012   NA 144 06/17/2012   K 3.7 06/17/2012   CL 102 06/17/2012   CREATININE 0.99 06/17/2012   BUN 10 06/17/2012   CO2 32 06/17/2012    No results found.  Assessment & Plan:   Bryan Wilson was seen today for fatigue, weight loss and insomnia.  Diagnoses and all orders for this visit:  Insomnia -     CBC with Differential/Platelet -     CMP14+EGFR -     Lipid panel -     POCT urinalysis dipstick -     Vitamin D 1,25 dihydroxy -     TSH -     Testosterone,Free and Total -     Sedimentation rate  Chronic fatigue -     CBC with Differential/Platelet -     CMP14+EGFR -     Lipid panel -     POCT urinalysis dipstick -     Vitamin D 1,25 dihydroxy -     TSH -     Testosterone,Free and Total -     Sedimentation rate  Muscular deconditioning -     CBC with Differential/Platelet -     CMP14+EGFR -     Lipid panel -     POCT urinalysis dipstick -     Vitamin D 1,25 dihydroxy -     TSH -     Testosterone,Free and Total -     Sedimentation rate  Anxiety and depression -     CBC with Differential/Platelet -     CMP14+EGFR -     Lipid panel -     POCT urinalysis dipstick -     Vitamin D 1,25 dihydroxy -     TSH -     Testosterone,Free and Total -     Sedimentation rate  Other orders -     traZODone (DESYREL) 150 MG tablet; Take 1 tablet (150 mg total) by mouth at bedtime. For sleep      patient denies anxiety and depression, however he does admit to lots of stress in his life from financial  issues. I believe he probably has more than he has insight into.  I am having Mr. Bryan Wilson start on traZODone. I am also having him maintain his fexofenadine. We will continue to administer methylPREDNISolone acetate.  Meds ordered this encounter  Medications  . traZODone (DESYREL) 150 MG tablet  Sig: Take 1 tablet (150 mg total) by mouth at bedtime. For sleep    Dispense:  30 tablet    Refill:  2   30 min was spent more than half  in discussion of issues surrounding fatigue and conditioning as well as sleep hygeine  Follow-up: Return in about 6 weeks (around 04/20/2015) for CPE.  Claretta Fraise, M.D.

## 2015-03-12 ENCOUNTER — Telehealth: Payer: Self-pay | Admitting: *Deleted

## 2015-03-12 NOTE — Telephone Encounter (Signed)
-----   Message from Mechele ClaudeWarren Stacks, MD sent at 03/10/2015 10:36 AM EST ----- Constance HawHello Sherrel,    Your lab result is normal.Some minor variations that are not significant are commonly marked abnormal, but do not represent any medical problem for you. (The vitamin D is not back yet.)  Best regards, Mechele ClaudeWarren Stacks, M.D.

## 2015-03-12 NOTE — Telephone Encounter (Signed)
Pt notified of results Verbalizes understanding 

## 2015-03-13 LAB — CMP14+EGFR
A/G RATIO: 1.8 (ref 1.1–2.5)
ALK PHOS: 63 IU/L (ref 39–117)
ALT: 14 IU/L (ref 0–44)
AST: 23 IU/L (ref 0–40)
Albumin: 4.5 g/dL (ref 3.5–5.5)
BUN/Creatinine Ratio: 6 — ABNORMAL LOW (ref 8–19)
BUN: 7 mg/dL (ref 6–20)
Bilirubin Total: 0.5 mg/dL (ref 0.0–1.2)
CALCIUM: 9.5 mg/dL (ref 8.7–10.2)
CO2: 25 mmol/L (ref 18–29)
CREATININE: 1.11 mg/dL (ref 0.76–1.27)
Chloride: 102 mmol/L (ref 96–106)
GFR calc Af Amer: 100 mL/min/{1.73_m2} (ref >59)
GFR, EST NON AFRICAN AMERICAN: 86 mL/min/{1.73_m2} (ref >59)
Globulin, Total: 2.5 g/dL (ref 1.5–4.5)
Glucose: 90 mg/dL (ref 65–99)
POTASSIUM: 4.1 mmol/L (ref 3.5–5.2)
Sodium: 142 mmol/L (ref 134–144)
Total Protein: 7 g/dL (ref 6.0–8.5)

## 2015-03-13 LAB — LIPID PANEL
CHOL/HDL RATIO: 4 ratio (ref 0.0–5.0)
CHOLESTEROL TOTAL: 172 mg/dL (ref 100–199)
HDL: 43 mg/dL (ref >39)
LDL Calculated: 116 mg/dL — ABNORMAL HIGH (ref 0–99)
TRIGLYCERIDES: 67 mg/dL (ref 0–149)
VLDL Cholesterol Cal: 13 mg/dL (ref 5–40)

## 2015-03-13 LAB — CBC WITH DIFFERENTIAL/PLATELET
BASOS ABS: 0 10*3/uL (ref 0.0–0.2)
BASOS: 1 %
EOS (ABSOLUTE): 0.2 10*3/uL (ref 0.0–0.4)
EOS: 5 %
Hematocrit: 41 % (ref 37.5–51.0)
Hemoglobin: 14.4 g/dL (ref 12.6–17.7)
IMMATURE GRANULOCYTES: 0 %
Immature Grans (Abs): 0 10*3/uL (ref 0.0–0.1)
LYMPHS: 34 %
Lymphocytes Absolute: 1.3 10*3/uL (ref 0.7–3.1)
MCH: 28.6 pg (ref 26.6–33.0)
MCHC: 35.1 g/dL (ref 31.5–35.7)
MCV: 82 fL (ref 79–97)
MONOCYTES: 5 %
Monocytes Absolute: 0.2 10*3/uL (ref 0.1–0.9)
NEUTROS ABS: 2.1 10*3/uL (ref 1.4–7.0)
Neutrophils: 55 %
PLATELETS: 177 10*3/uL (ref 150–379)
RBC: 5.03 x10E6/uL (ref 4.14–5.80)
RDW: 13 % (ref 12.3–15.4)
WBC: 3.9 10*3/uL (ref 3.4–10.8)

## 2015-03-13 LAB — SEDIMENTATION RATE: SED RATE: 2 mm/h (ref 0–15)

## 2015-03-13 LAB — VITAMIN D 1,25 DIHYDROXY
VITAMIN D3 1, 25 (OH): 56 pg/mL
Vitamin D 1, 25 (OH)2 Total: 57 pg/mL
Vitamin D2 1, 25 (OH)2: <10 pg/mL

## 2015-03-13 LAB — TSH: TSH: 2.95 u[IU]/mL (ref 0.450–4.500)

## 2015-03-13 LAB — TESTOSTERONE,FREE AND TOTAL
TESTOSTERONE FREE: 8.3 pg/mL — AB (ref 8.7–25.1)
Testosterone: 417 ng/dL (ref 348–1197)

## 2015-06-09 ENCOUNTER — Encounter: Payer: Self-pay | Admitting: Family Medicine

## 2015-06-09 ENCOUNTER — Ambulatory Visit (INDEPENDENT_AMBULATORY_CARE_PROVIDER_SITE_OTHER): Payer: BLUE CROSS/BLUE SHIELD | Admitting: Family Medicine

## 2015-06-09 VITALS — BP 102/67 | HR 68 | Temp 97.1°F | Ht 69.0 in | Wt 183.4 lb

## 2015-06-09 DIAGNOSIS — IMO0001 Reserved for inherently not codable concepts without codable children: Secondary | ICD-10-CM

## 2015-06-09 DIAGNOSIS — N50812 Left testicular pain: Secondary | ICD-10-CM

## 2015-06-09 DIAGNOSIS — Z0000 Encounter for general adult medical examination without abnormal findings: Secondary | ICD-10-CM | POA: Diagnosis not present

## 2015-06-09 DIAGNOSIS — N5089 Other specified disorders of the male genital organs: Secondary | ICD-10-CM | POA: Insufficient documentation

## 2015-06-09 NOTE — Progress Notes (Signed)
Subjective:  Patient ID: Bryan Wilson, male    DOB: 1980/09/03  Age: 35 y.o. MRN: [id number]  CC: Annual Exam   HPI Bryan Wilson presents for CPE   History Bryan Wilson has a past medical history of Allergy; Anxiety; Depression; and Nephrolithiasis (06/2013).   Bryan Wilson has past surgical history that includes Vasectomy.   His family history includes Kidney Stones in his father. There is no history of Alcohol abuse, Asthma, Heart disease, Hyperlipidemia, Hypertension, or Cancer.Bryan Wilson reports that Bryan Wilson has never smoked. Bryan Wilson does not have any smokeless tobacco history on file. Bryan Wilson reports that Bryan Wilson drinks about 1.2 - 1.8 oz of alcohol per week. Bryan Wilson reports that Bryan Wilson does not use illicit drugs.    ROS Review of Systems  Constitutional: Negative for fever, chills, diaphoresis, activity change, appetite change, fatigue and unexpected weight change.  HENT: Negative for congestion, ear pain, hearing loss, postnasal drip, rhinorrhea, sore throat, tinnitus and trouble swallowing.   Eyes: Negative for photophobia, pain, discharge and redness.  Respiratory: Negative for apnea, cough, choking, chest tightness, shortness of breath, wheezing and stridor.   Cardiovascular: Negative for chest pain, palpitations and leg swelling.  Gastrointestinal: Negative for nausea, vomiting, abdominal pain, diarrhea, constipation, blood in stool and abdominal distention.  Endocrine: Negative for cold intolerance, heat intolerance, polydipsia, polyphagia and polyuria.  Genitourinary: Negative for dysuria, urgency, frequency, hematuria, flank pain, enuresis, difficulty urinating and genital sores.  Musculoskeletal: Negative for joint swelling and arthralgias.  Skin: Negative for color change, rash and wound.  Allergic/Immunologic: Negative for immunocompromised state.  Neurological: Negative for dizziness, tremors, seizures, syncope, facial asymmetry, speech difficulty, weakness, light-headedness, numbness and headaches.    Hematological: Does not bruise/bleed easily.  Psychiatric/Behavioral: Negative for suicidal ideas, hallucinations, behavioral problems, confusion, sleep disturbance (resolved his insomnia and DCed the trazodone with buying a new mattress), dysphoric mood, decreased concentration and agitation. The patient is not nervous/anxious and is not hyperactive.     Objective:  BP 102/67 mmHg  Pulse 68  Temp(Src) 97.1 F (36.2 C) (Oral)  Ht '5\' 9"'  (1.753 m)  Wt 183 lb 6.4 oz (83.19 kg)  BMI 27.07 kg/m2  SpO2 99%  BP Readings from Last 3 Encounters:  06/09/15 102/67  03/09/15 113/66  06/05/14 110/66    Wt Readings from Last 3 Encounters:  06/09/15 183 lb 6.4 oz (83.19 kg)  03/09/15 185 lb (83.915 kg)  06/05/14 186 lb (84.369 kg)     Physical Exam  Constitutional: Bryan Wilson is oriented to person, place, and time. Bryan Wilson appears well-developed and well-nourished.  HENT:  Head: Normocephalic and atraumatic.  Mouth/Throat: Oropharynx is clear and moist.  Eyes: EOM are normal. Pupils are equal, round, and reactive to light.  Neck: Normal range of motion. No tracheal deviation present. No thyromegaly present.  Cardiovascular: Normal rate, regular rhythm and normal heart sounds.  Exam reveals no gallop and no friction rub.   No murmur heard. Pulmonary/Chest: Breath sounds normal. Bryan Wilson has no wheezes. Bryan Wilson has no rales.  Abdominal: Soft. Bryan Wilson exhibits no mass. There is no tenderness.  Genitourinary: Penis normal. No penile tenderness.  Lower left scrotum has 4 mm size irregular nodule. Not connected to testicle, but closely approximate to vas efferens region of lower pole.  Musculoskeletal: Normal range of motion. Bryan Wilson exhibits no edema.  Neurological: Bryan Wilson is alert and oriented to person, place, and time.  Skin: Skin is warm and dry.  Psychiatric: Bryan Wilson has a normal mood and affect.     Lab Results  Component Value Date   WBC 3.9 03/09/2015   HGB 14.8 06/17/2012   HCT 41.0 03/09/2015   PLT 177 03/09/2015    GLUCOSE 90 03/09/2015   CHOL 172 03/09/2015   TRIG 67 03/09/2015   HDL 43 03/09/2015   LDLCALC 116* 03/09/2015   ALT 14 03/09/2015   AST 23 03/09/2015   NA 142 03/09/2015   K 4.1 03/09/2015   CL 102 03/09/2015   CREATININE 1.11 03/09/2015   BUN 7 03/09/2015   CO2 25 03/09/2015   TSH 2.950 03/09/2015    No results found.  Assessment & Plan:   Bryan Wilson was seen today for annual exam.  Diagnoses and all orders for this visit:  Well adult -     CBC with Differential/Platelet -     CMP14+EGFR -     Lipid panel -     Urinalysis  Sperm granuloma  Testicular pain, left    Monitor nodule if enlrges will need [place].  I have discontinued Bryan Wilson's traZODone. I am also having him maintain his fexofenadine. We will continue to administer methylPREDNISolone acetate.  No orders of the defined types were placed in this encounter.     Follow-up: Return in about 1 year (around 06/08/2016).  Claretta Fraise, M.D.

## 2015-06-10 LAB — CBC WITH DIFFERENTIAL/PLATELET
BASOS: 1 %
Basophils Absolute: 0 10*3/uL (ref 0.0–0.2)
EOS (ABSOLUTE): 0.2 10*3/uL (ref 0.0–0.4)
EOS: 5 %
HEMATOCRIT: 42.3 % (ref 37.5–51.0)
HEMOGLOBIN: 14.6 g/dL (ref 12.6–17.7)
IMMATURE GRANULOCYTES: 0 %
Immature Grans (Abs): 0 10*3/uL (ref 0.0–0.1)
Lymphocytes Absolute: 1.1 10*3/uL (ref 0.7–3.1)
Lymphs: 23 %
MCH: 28.9 pg (ref 26.6–33.0)
MCHC: 34.5 g/dL (ref 31.5–35.7)
MCV: 84 fL (ref 79–97)
MONOCYTES: 5 %
Monocytes Absolute: 0.3 10*3/uL (ref 0.1–0.9)
NEUTROS PCT: 66 %
Neutrophils Absolute: 3.3 10*3/uL (ref 1.4–7.0)
Platelets: 195 10*3/uL (ref 150–379)
RBC: 5.06 x10E6/uL (ref 4.14–5.80)
RDW: 14.1 % (ref 12.3–15.4)
WBC: 5 10*3/uL (ref 3.4–10.8)

## 2015-06-10 LAB — CMP14+EGFR
A/G RATIO: 1.6 (ref 1.2–2.2)
ALBUMIN: 4.5 g/dL (ref 3.5–5.5)
ALT: 13 IU/L (ref 0–44)
AST: 21 IU/L (ref 0–40)
Alkaline Phosphatase: 68 IU/L (ref 39–117)
BUN / CREAT RATIO: 10 (ref 9–20)
BUN: 11 mg/dL (ref 6–20)
Bilirubin Total: 0.6 mg/dL (ref 0.0–1.2)
CALCIUM: 9.6 mg/dL (ref 8.7–10.2)
CHLORIDE: 101 mmol/L (ref 96–106)
CO2: 26 mmol/L (ref 18–29)
CREATININE: 1.06 mg/dL (ref 0.76–1.27)
GFR, EST AFRICAN AMERICAN: 105 mL/min/{1.73_m2} (ref >59)
GFR, EST NON AFRICAN AMERICAN: 91 mL/min/{1.73_m2} (ref >59)
GLOBULIN, TOTAL: 2.8 g/dL (ref 1.5–4.5)
Glucose: 84 mg/dL (ref 65–99)
POTASSIUM: 4.2 mmol/L (ref 3.5–5.2)
Sodium: 140 mmol/L (ref 134–144)
TOTAL PROTEIN: 7.3 g/dL (ref 6.0–8.5)

## 2015-06-10 LAB — LIPID PANEL
CHOL/HDL RATIO: 3.8 ratio (ref 0.0–5.0)
CHOLESTEROL TOTAL: 168 mg/dL (ref 100–199)
HDL: 44 mg/dL (ref >39)
LDL CALC: 108 mg/dL — AB (ref 0–99)
Triglycerides: 81 mg/dL (ref 0–149)
VLDL CHOLESTEROL CAL: 16 mg/dL (ref 5–40)

## 2015-09-01 ENCOUNTER — Ambulatory Visit (INDEPENDENT_AMBULATORY_CARE_PROVIDER_SITE_OTHER): Payer: BLUE CROSS/BLUE SHIELD | Admitting: Family Medicine

## 2015-09-01 ENCOUNTER — Ambulatory Visit (INDEPENDENT_AMBULATORY_CARE_PROVIDER_SITE_OTHER): Payer: BLUE CROSS/BLUE SHIELD

## 2015-09-01 VITALS — BP 100/60 | HR 74 | Temp 98.6°F | Ht 69.0 in | Wt 187.2 lb

## 2015-09-01 DIAGNOSIS — M25512 Pain in left shoulder: Secondary | ICD-10-CM | POA: Diagnosis not present

## 2015-09-01 DIAGNOSIS — S43422A Sprain of left rotator cuff capsule, initial encounter: Secondary | ICD-10-CM

## 2015-09-01 MED ORDER — DICLOFENAC SODIUM 75 MG PO TBEC: 75.0000 mg | DELAYED_RELEASE_TABLET | Freq: Two times a day (BID) | ORAL | 2 refills | Status: DC
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
## 2015-12-06 ENCOUNTER — Telehealth: Payer: Self-pay | Admitting: Family Medicine

## 2015-12-06 NOTE — Telephone Encounter (Signed)
Documented

## 2016-01-22 ENCOUNTER — Encounter: Payer: Self-pay | Admitting: Nurse Practitioner

## 2016-01-22 ENCOUNTER — Ambulatory Visit (INDEPENDENT_AMBULATORY_CARE_PROVIDER_SITE_OTHER): Payer: BLUE CROSS/BLUE SHIELD | Admitting: Nurse Practitioner

## 2016-01-22 VITALS — BP 120/76 | HR 108 | Temp 102.0°F | Ht 69.0 in | Wt 186.0 lb

## 2016-01-22 DIAGNOSIS — R6889 Other general symptoms and signs: Secondary | ICD-10-CM

## 2016-01-22 DIAGNOSIS — J101 Influenza due to other identified influenza virus with other respiratory manifestations: Secondary | ICD-10-CM | POA: Diagnosis not present

## 2016-01-22 MED ORDER — OSELTAMIVIR PHOSPHATE 75 MG PO CAPS: 75.0000 mg | ORAL_CAPSULE | Freq: Two times a day (BID) | ORAL | 0 refills | Status: DC

## 2016-01-22 NOTE — Patient Instructions (Signed)

## 2016-01-22 NOTE — Progress Notes (Signed)
   Subjective:    Patient ID: Bryan BaxterMatthew K Usery, male    DOB: 1980/11/23, 36 y.o.   MRN: [id number]  HPI  Patient comes in with his wife c/o nausea and vomiting that just started today- Chills , fever, body aches, headache. Cough and scratchy throat. Started late Thursday evening.   Review of Systems  Constitutional: Positive for chills and fever.  HENT: Positive for congestion and sore throat.   Respiratory: Positive for cough.   Cardiovascular: Negative.   Gastrointestinal: Positive for nausea and vomiting.  Genitourinary: Negative.   Neurological: Negative.   Psychiatric/Behavioral: Negative.   All other systems reviewed and are negative.      Objective:   Physical Exam  Constitutional: He is oriented to person, place, and time. He appears well-developed and well-nourished. No distress.  HENT:  Right Ear: Hearing, tympanic membrane, external ear and ear canal normal.  Left Ear: Hearing, tympanic membrane and external ear normal.  Nose: Mucosal edema and rhinorrhea present. Right sinus exhibits no maxillary sinus tenderness and no frontal sinus tenderness. Left sinus exhibits no maxillary sinus tenderness and no frontal sinus tenderness.  Mouth/Throat: Uvula is midline, oropharynx is clear and moist and mucous membranes are normal.  Cardiovascular: Normal rate and regular rhythm.   Pulmonary/Chest: Effort normal and breath sounds normal.  Abdominal: Soft. Bowel sounds are normal.  Neurological: He is alert and oriented to person, place, and time.  Skin: Skin is warm.  Psychiatric: He has a normal mood and affect. His behavior is normal. Judgment and thought content normal.   BP 120/76 (BP Location: Left Arm)   Pulse (!) 108   Temp (!) 102 F (38.9 C) (Oral)   Ht 5\' 9"  (1.753 m)   Wt 186 lb (84.4 kg)   BMI 27.47 kg/m   Flu A positive  B negative       Assessment & Plan:   1. Flu-like symptoms   2. Influenza A    Meds ordered this encounter  Medications  .  oseltamivir (TAMIFLU) 75 MG capsule    Sig: Take 1 capsule (75 mg total) by mouth 2 (two) times daily.    Dispense:  10 capsule    Refill:  0    Order Specific Question:   Supervising Provider    Answer:   VINCENT, CAROL L [[id number]]   1. Take meds as prescribed 2. Use a cool mist humidifier especially during the winter months and when heat has been humid. 3. Use saline nose sprays frequently 4. Saline irrigations of the nose can be very helpful if done frequently.  * 4X daily for 1 week*  * Use of a nettie pot can be helpful with this. Follow directions with this* 5. Drink plenty of fluids 6. Keep thermostat turn down low 7.For any cough or congestion  Use plain Mucinex- regular strength or max strength is fine   * Children- consult with Pharmacist for dosing 8. For fever or aces or pains- take tylenol or ibuprofen appropriate for age and weight.  * for fevers greater than 101 orally you may alternate ibuprofen and tylenol every  3 hours.   Mary-Margaret Daphine DeutscherMartin, FNP

## 2016-01-24 LAB — VERITOR FLU A/B WAIVED
Influenza A: POSITIVE — AB
Influenza B: NEGATIVE

## 2016-03-07 ENCOUNTER — Encounter: Payer: Self-pay | Admitting: Gastroenterology

## 2016-03-07 ENCOUNTER — Ambulatory Visit (INDEPENDENT_AMBULATORY_CARE_PROVIDER_SITE_OTHER): Payer: BLUE CROSS/BLUE SHIELD | Admitting: Gastroenterology

## 2016-03-07 VITALS — BP 126/74 | HR 80 | Ht 69.0 in | Wt 186.2 lb

## 2016-03-07 DIAGNOSIS — R1310 Dysphagia, unspecified: Secondary | ICD-10-CM

## 2016-03-07 NOTE — Patient Instructions (Addendum)
You have been scheduled for an Endoscopy March 26,2018 at 7:30 am, arrive at 7:00am on the 4th floor . Instructions given to patient.

## 2016-03-07 NOTE — Progress Notes (Signed)
HPI :  36 y/o male with a history of seasonal allergies, renal stones, here for new patient visit for dysphagia  He thinks symptoms ongoing for a year. He reports intermittent symptoms dysphagia. He feels foods get stuck in the sternal notch, and sometimes lower in the chest. He usually drinks something to push it down. At times he has had close call for impaction. Only occurs with solids, not liquids. He also has hiccups at times along with his dysphagia. No reflux or heartburn. No nausea, no vomiting. No abdominal pain. He has seasonal allergies and is on allegra for this. No known food allergies. No weight loss  Grand mother had throat cancer, grandfather had colon cancer. No other family history for colon cancer    Past Medical History:  Diagnosis Date  . Allergy   . Anxiety   . Depression   . Nephrolithiasis 06/2013     Past Surgical History:  Procedure Laterality Date  . VASECTOMY     Family History  Problem Relation Age of Onset  . Kidney Stones Father   . Alcohol abuse Neg Hx   . Asthma Neg Hx   . Heart disease Neg Hx   . Hyperlipidemia Neg Hx   . Hypertension Neg Hx   . Cancer Neg Hx    Social History  Substance Use Topics  . Smoking status: Never Smoker  . Smokeless tobacco: Never Used  . Alcohol use 1.2 - 1.8 oz/week    2 - 3 Cans of beer per week     Comment: occ   Current Outpatient Prescriptions  Medication Sig Dispense Refill  . fexofenadine (ALLEGRA) 180 MG tablet Take 180 mg by mouth daily.     No current facility-administered medications for this visit.    No Known Allergies   Review of Systems: All systems reviewed and negative except where noted in HPI.   Lab Results  Component Value Date   WBC 5.0 06/09/2015   HGB 14.8 06/17/2012   HCT 42.3 06/09/2015   MCV 84 06/09/2015   PLT 195 06/09/2015    Lab Results  Component Value Date   CREATININE 1.06 06/09/2015   BUN 11 06/09/2015   NA 140 06/09/2015   K 4.2 06/09/2015   CL 101  06/09/2015   CO2 26 06/09/2015    Lab Results  Component Value Date   ALT 13 06/09/2015   AST 21 06/09/2015   ALKPHOS 68 06/09/2015   BILITOT 0.6 06/09/2015     Physical Exam: BP 126/74   Pulse 80   Ht 5\' 9"  (1.753 m)   Wt 186 lb 3.2 oz (84.5 kg)   BMI 27.50 kg/m  Constitutional: Pleasant,well-developed, male in no acute distress. HEENT: Normocephalic and atraumatic. Conjunctivae are normal. No scleral icterus. Neck supple.  Cardiovascular: Normal rate, regular rhythm.  Pulmonary/chest: Effort normal and breath sounds normal. No wheezing, rales or rhonchi. Abdominal: Soft, nondistended, nontender.  There are no masses palpable. No hepatomegaly. Extremities: no edema Lymphadenopathy: No cervical adenopathy noted. Neurological: Alert and oriented to person place and time. Skin: Skin is warm and dry. No rashes noted. Psychiatric: Normal mood and affect. Behavior is normal.   ASSESSMENT AND PLAN: 36 year old male with a medical history significant for seasonal allergies, presenting for roughly 1 year of intermittent solid food dysphagia. I discussed the differential for dysphagia with him. Eosinophilic esophagitis needs to be ruled out given his allergic history. Recommend EGD to further evaluate and perform dilation if needed. I discussed the  risks and benefits of endoscopy and sedation with him, and he wished proceed. Further recommendations pending this result.  Ileene PatrickSteven Eswin Worrell, MD [facility] Pager [phone]  CC: Mechele ClaudeStacks, [place], MD

## 2016-03-21 ENCOUNTER — Ambulatory Visit (AMBULATORY_SURGERY_CENTER): Payer: BLUE CROSS/BLUE SHIELD | Admitting: Gastroenterology

## 2016-03-21 ENCOUNTER — Encounter: Payer: BLUE CROSS/BLUE SHIELD | Admitting: Gastroenterology

## 2016-03-21 ENCOUNTER — Encounter: Payer: Self-pay | Admitting: Gastroenterology

## 2016-03-21 VITALS — BP 105/67 | HR 66 | Temp 97.7°F | Resp 11 | Ht 69.0 in | Wt 186.0 lb

## 2016-03-21 DIAGNOSIS — K228 Other specified diseases of esophagus: Secondary | ICD-10-CM | POA: Diagnosis not present

## 2016-03-21 DIAGNOSIS — K222 Esophageal obstruction: Secondary | ICD-10-CM

## 2016-03-21 DIAGNOSIS — R1310 Dysphagia, unspecified: Secondary | ICD-10-CM | POA: Diagnosis not present

## 2016-03-21 DIAGNOSIS — K229 Disease of esophagus, unspecified: Secondary | ICD-10-CM

## 2016-03-21 MED ORDER — OMEPRAZOLE 40 MG PO CPDR: 40.0000 mg | DELAYED_RELEASE_CAPSULE | Freq: Every day | ORAL | 1 refills | Status: DC

## 2016-03-21 MED ORDER — SODIUM CHLORIDE 0.9 % IV SOLN: 500.0000 mL | INTRAVENOUS | Status: DC

## 2016-03-21 MED FILL — OMEPRAZOLE DR 40 MG CAPSULE: 40 | 90 days supply | Qty: 90 | Fill #0

## 2016-03-21 NOTE — Op Note (Signed)
[facility] Patient Name: Bryan Wilson Procedure Date: 03/21/2016 9:30 AM MRN: [id number] Endoscopist: Viviann Spare P. Armbruster MD, MD Age: 36 Referring MD:  Date of Birth: Jun 06, 1980 Gender: Male Account #: [id number] Procedure:                Upper GI endoscopy Indications:              Dysphagia Medicines:                Monitored Anesthesia Care Procedure:                Pre-Anesthesia Assessment:                           - Prior to the procedure, a History and Physical                            was performed, and patient medications and                            allergies were reviewed. The patient's tolerance of                            previous anesthesia was also reviewed. The risks                            and benefits of the procedure and the sedation                            options and risks were discussed with the patient.                            All questions were answered, and informed consent                            was obtained. Prior Anticoagulants: The patient has                            taken no previous anticoagulant or antiplatelet                            agents. ASA Grade Assessment: II - A patient with                            mild systemic disease. After reviewing the risks                            and benefits, the patient was deemed in                            satisfactory condition to undergo the procedure.                           After obtaining informed consent, the endoscope was  passed under direct vision. Throughout the                            procedure, the patient's blood pressure, pulse, and                            oxygen saturations were monitored continuously. The                            Endoscope was introduced through the mouth, and                            advanced to the second part of duodenum. The upper                            GI endoscopy was accomplished without  difficulty.                            The patient tolerated the procedure well. Scope In: Scope Out: Findings:                 Esophagogastric landmarks were identified: the                            Z-line was found at 39 cm, the gastroesophageal                            junction was found at 39 cm and the upper extent of                            the gastric folds was found at 39 cm from the                            incisors. There was a suggestion of possible mild                            stenosis at the GEJ.                           Mucosal changes including mild ringed esophagus and                            longitudinal furrows were found in the entire                            esophagus. Biopsies were obtained from the proximal                            and distal esophagus with cold forceps for                            histology of suspected eosinophilic esophagitis.  2 small areas of ectopic gastric mucosa were found                            in the upper third of the esophagus.                           A guidewire was placed and the scope was withdrawn.                            Dilation was performed in the entire esophagus with                            a Savary dilator with mild resistance at 17 mm and                            18 mm. Re-look with the endoscope did not show any                            mucosal wrents.                           The entire examined stomach was normal.                           The duodenal bulb and second portion of the                            duodenum were normal. Complications:            No immediate complications. Estimated blood loss:                            Minimal. Estimated Blood Loss:     Estimated blood loss was minimal. Impression:               - Esophagogastric landmarks identified.                           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 Biopsied.                           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                           - Normal stomach.                           - Normal duodenal bulb and second portion of the                            duodenum.                           Overall, suspect underlying eosinophilic                            esophagitis may be the cause of symptoms. Await                            pathology results. Recommendation:           - Patient has a contact number available for                            emergencies. The signs and symptoms of potential                            delayed complications were discussed with the                            patient. Return to normal activities tomorrow.                            Written discharge instructions were provided to the                            patient.                           - Resume previous diet.                           - Continue present medications.                           - Start omeprazole  once daily                           - Await pathology results and course following                            dilation Steven P. Armbruster MD, MD 03/21/2016 9:54:30 AM This report has been signed electronically.

## 2016-03-21 NOTE — Progress Notes (Signed)
A and O x3. Report to RN. Tolerated MAC anesthesia well.Teeth unchanged after procedure.

## 2016-03-21 NOTE — Patient Instructions (Signed)
Hand out given on strictures  YOU HAD AN ENDOSCOPIC PROCEDURE TODAY: Refer to the procedure report and other information in the discharge instructions given to you for any specific questions about what was found during the examination. If this information does not answer your questions, please call [place] office at [phone] to clarify.   YOU SHOULD EXPECT: Some feelings of bloating in the abdomen. Passage of more gas than usual. Walking can help get rid of the air that was put into your GI tract during the procedure and reduce the bloating. If you had a lower endoscopy (such as a colonoscopy or flexible sigmoidoscopy) you may notice spotting of blood in your stool or on the toilet paper. Some abdominal soreness may be present for a day or two, also.  DIET: Your first meal following the procedure should be a light meal and then it is ok to progress to your normal diet. A half-sandwich or bowl of soup is an example of a good first meal. Heavy or fried foods are harder to digest and may make you feel nauseous or bloated. Drink plenty of fluids but you should avoid alcoholic beverages for 24 hours. If you had a esophageal dilation, please see attached instructions for diet.    ACTIVITY: Your care partner should take you home directly after the procedure. You should plan to take it easy, moving slowly for the rest of the day. You can resume normal activity the day after the procedure however YOU SHOULD NOT DRIVE, use power tools, machinery or perform tasks that involve climbing or major physical exertion for 24 hours (because of the sedation medicines used during the test).   SYMPTOMS TO REPORT IMMEDIATELY: A gastroenterologist can be reached at any hour. Please call [phone]  for any of the following symptoms:   Following upper endoscopy (EGD, EUS, ERCP, esophageal dilation) Vomiting of blood or coffee ground material  New, significant abdominal pain  New, significant chest pain or pain under  the shoulder blades  Painful or persistently difficult swallowing  New shortness of breath  Black, tarry-looking or red, bloody stools  FOLLOW UP:  If any biopsies were taken you will be contacted by phone or by letter within the next 1-3 weeks. Call [phone]  if you have not heard about the biopsies in 3 weeks.  Please also call with any specific questions about appointments or follow up tests.

## 2016-03-21 NOTE — Progress Notes (Signed)
Called to room to assist during endoscopic procedure.  Patient ID and intended procedure confirmed with present staff. Received instructions for my participation in the procedure from the performing physician.  

## 2016-03-22 ENCOUNTER — Telehealth: Payer: Self-pay

## 2016-03-22 NOTE — Telephone Encounter (Signed)
  Follow up Call-  Call back number 03/21/2016  Post procedure Call Back phone  # [phone]  Permission to leave phone message Yes  Some recent data might be hidden     Patient questions:  Do you have a fever, pain , or abdominal swelling? No. Pain Score  0 *  Have you tolerated food without any problems? Yes.    Have you been able to return to your normal activities? Yes.    Do you have any questions about your discharge instructions: Diet   No. Medications  No. Follow up visit  No.  Do you have questions or concerns about your Care? No.  Actions: * If pain score is 4 or above: No action needed, pain <4.

## 2016-03-28 ENCOUNTER — Other Ambulatory Visit: Payer: Self-pay | Admitting: Emergency Medicine

## 2016-03-28 DIAGNOSIS — K200 Eosinophilic esophagitis: Secondary | ICD-10-CM

## 2016-04-03 ENCOUNTER — Encounter: Payer: BLUE CROSS/BLUE SHIELD | Admitting: Gastroenterology

## 2016-05-19 ENCOUNTER — Encounter: Payer: BLUE CROSS/BLUE SHIELD | Admitting: Gastroenterology

## 2016-07-07 ENCOUNTER — Ambulatory Visit (INDEPENDENT_AMBULATORY_CARE_PROVIDER_SITE_OTHER): Payer: BLUE CROSS/BLUE SHIELD | Admitting: Family Medicine

## 2016-07-07 ENCOUNTER — Encounter: Payer: Self-pay | Admitting: *Deleted

## 2016-07-07 ENCOUNTER — Encounter: Payer: Self-pay | Admitting: Family Medicine

## 2016-07-07 VITALS — BP 102/66 | HR 56 | Temp 97.8°F | Ht 69.0 in | Wt 187.0 lb

## 2016-07-07 DIAGNOSIS — K2100 Gastro-esophageal reflux disease with esophagitis, without bleeding: Secondary | ICD-10-CM

## 2016-07-07 DIAGNOSIS — Z0000 Encounter for general adult medical examination without abnormal findings: Secondary | ICD-10-CM

## 2016-07-07 DIAGNOSIS — F5101 Primary insomnia: Secondary | ICD-10-CM

## 2016-07-07 NOTE — Progress Notes (Signed)
Subjective:  Patient ID: Bryan Wilson, male    DOB: 1980/01/15  Age: 36 y.o. MRN: [id number]  CC: Annual Exam (pt here today for CPE, no concerns voiced.)   HPI DRAY DENTE presents for Well exam. No symptoms currently but he describes a recent episode of choking. He was eating some chicken to 3 months ago and it got stuck in his throat and he couldn't even swallow water. With some effort he was able to get it down but subsequently had to have EGD. That was accompanied by stricture dilation. Since that time he's been taking Prilosec and avoiding peanuts. He is considering food allergy testing. Since he is doing well now he wants to defer that.  Depression screen [facility] 2/9 07/07/2016 01/22/2016 09/01/2015  Decreased Interest 0 0 0  Down, Depressed, Hopeless 0 0 0  PHQ - 2 Score 0 0 0    History Kru has a past medical history of Allergy; Anxiety; Depression; and Nephrolithiasis (06/2013).   He has a past surgical history that includes Vasectomy.   His family history includes Kidney Stones in his father.He reports that he has never smoked. He has never used smokeless tobacco. He reports that he drinks about 1.2 - 1.8 oz of alcohol per week . He reports that he does not use drugs.    ROS Review of Systems  Constitutional: Negative for activity change, appetite change, chills, diaphoresis, fatigue, fever and unexpected weight change.  HENT: Negative for congestion, ear pain, hearing loss, postnasal drip, rhinorrhea, sore throat, tinnitus and trouble swallowing.   Eyes: Negative for photophobia, pain, discharge and redness.  Respiratory: Negative for apnea, cough, choking, chest tightness, shortness of breath, wheezing and stridor.   Cardiovascular: Negative for chest pain, palpitations and leg swelling.  Gastrointestinal: Negative for abdominal distention, abdominal pain, blood in stool, constipation, diarrhea, nausea and vomiting.  Endocrine: Negative for cold intolerance, heat  intolerance, polydipsia, polyphagia and polyuria.  Genitourinary: Negative for difficulty urinating, dysuria, enuresis, flank pain, frequency, genital sores, hematuria and urgency.  Musculoskeletal: Negative for arthralgias and joint swelling.  Skin: Negative for color change, rash and wound.  Allergic/Immunologic: Negative for immunocompromised state.  Neurological: Negative for dizziness, tremors, seizures, syncope, facial asymmetry, speech difficulty, weakness, light-headedness, numbness and headaches.  Hematological: Does not bruise/bleed easily.  Psychiatric/Behavioral: Negative for agitation, behavioral problems, confusion, decreased concentration, dysphoric mood, hallucinations, sleep disturbance and suicidal ideas. The patient is not nervous/anxious and is not hyperactive.     Objective:  BP 102/66   Pulse (!) 56   Temp 97.8 F (36.6 C) (Oral)   Ht _0  (1.753 m)   Wt 187 lb (84.8 kg)   BMI 27.62 kg/m   BP Readings from Last 3 Encounters:  07/07/16 102/66  03/21/16 105/67  03/07/16 126/74    Wt Readings from Last 3 Encounters:  07/07/16 187 lb (84.8 kg)  03/21/16 186 lb (84.4 kg)  03/07/16 186 lb 3.2 oz (84.5 kg)     Physical Exam  Constitutional: He is oriented to person, place, and time. He appears well-developed and well-nourished.  HENT:  Head: Normocephalic and atraumatic.  Mouth/Throat: Oropharynx is clear and moist.  Eyes: EOM are normal. Pupils are equal, round, and reactive to light.  Neck: Normal range of motion. No tracheal deviation present. No thyromegaly present.  Cardiovascular: Normal rate, regular rhythm and normal heart sounds.  Exam reveals no gallop and no friction rub.   No murmur heard. Pulmonary/Chest: Breath sounds normal. He has no  wheezes. He has no rales.  Abdominal: Soft. He exhibits no mass. There is no tenderness.  Musculoskeletal: Normal range of motion. He exhibits no edema.  Neurological: He is alert and oriented to person, place,  and time.  Skin: Skin is warm and dry.  Psychiatric: He has a normal mood and affect.      Assessment & Plan:   Jayzen was seen today for annual exam.  Diagnoses and all orders for this visit:  Well adult exam -     CBC with Differential/Platelet -     CMP14+EGFR -     Lipid panel -     Urinalysis  Primary insomnia  Gastroesophageal reflux disease with esophagitis       I have discontinued Mr. Pyles's fexofenadine. I am also having him maintain his omeprazole and cetirizine. We will continue to administer sodium chloride.  Allergies as of 07/07/2016   No Known Allergies     Medication List       Accurate as of 07/07/16 10:19 AM. Always use your most recent med list.          cetirizine 10 MG tablet Commonly known as:  ZYRTEC Take 10 mg by mouth daily.   omeprazole 40 MG capsule Commonly known as:  PRILOSEC Take 1 capsule (40 mg total) by mouth daily.        Follow-up: Return in about 1 year (around 07/07/2017).  Claretta Fraise, M.D.

## 2016-07-08 LAB — LIPID PANEL
CHOLESTEROL TOTAL: 204 mg/dL — AB (ref 100–199)
Chol/HDL Ratio: 4.2 ratio (ref 0.0–5.0)
HDL: 49 mg/dL (ref >39)
LDL Calculated: 138 mg/dL — ABNORMAL HIGH (ref 0–99)
TRIGLYCERIDES: 84 mg/dL (ref 0–149)
VLDL Cholesterol Cal: 17 mg/dL (ref 5–40)

## 2016-07-08 LAB — CBC WITH DIFFERENTIAL/PLATELET
BASOS ABS: 0 10*3/uL (ref 0.0–0.2)
Basos: 1 %
EOS (ABSOLUTE): 0.3 10*3/uL (ref 0.0–0.4)
Eos: 7 %
HEMATOCRIT: 42.2 % (ref 37.5–51.0)
Hemoglobin: 14.7 g/dL (ref 13.0–17.7)
Immature Grans (Abs): 0 10*3/uL (ref 0.0–0.1)
Immature Granulocytes: 0 %
LYMPHS ABS: 1.3 10*3/uL (ref 0.7–3.1)
Lymphs: 34 %
MCH: 28.7 pg (ref 26.6–33.0)
MCHC: 34.8 g/dL (ref 31.5–35.7)
MCV: 82 fL (ref 79–97)
MONOCYTES: 6 %
MONOS ABS: 0.3 10*3/uL (ref 0.1–0.9)
NEUTROS PCT: 52 %
Neutrophils Absolute: 2 10*3/uL (ref 1.4–7.0)
Platelets: 169 10*3/uL (ref 150–379)
RBC: 5.13 x10E6/uL (ref 4.14–5.80)
RDW: 14.1 % (ref 12.3–15.4)
WBC: 3.9 10*3/uL (ref 3.4–10.8)

## 2016-07-08 LAB — CMP14+EGFR
A/G RATIO: 1.8 (ref 1.2–2.2)
ALK PHOS: 68 IU/L (ref 39–117)
ALT: 22 IU/L (ref 0–44)
AST: 28 IU/L (ref 0–40)
Albumin: 4.8 g/dL (ref 3.5–5.5)
BUN/Creatinine Ratio: 10 (ref 9–20)
BUN: 11 mg/dL (ref 6–20)
Bilirubin Total: 0.8 mg/dL (ref 0.0–1.2)
CO2: 26 mmol/L (ref 20–29)
Calcium: 9.5 mg/dL (ref 8.7–10.2)
Chloride: 101 mmol/L (ref 96–106)
Creatinine, Ser: 1.09 mg/dL (ref 0.76–1.27)
GFR calc Af Amer: 101 mL/min/{1.73_m2} (ref >59)
GFR calc non Af Amer: 87 mL/min/{1.73_m2} (ref >59)
GLOBULIN, TOTAL: 2.6 g/dL (ref 1.5–4.5)
Glucose: 85 mg/dL (ref 65–99)
POTASSIUM: 4.2 mmol/L (ref 3.5–5.2)
SODIUM: 141 mmol/L (ref 134–144)
Total Protein: 7.4 g/dL (ref 6.0–8.5)

## 2017-01-15 DIAGNOSIS — L821 Other seborrheic keratosis: Secondary | ICD-10-CM | POA: Diagnosis not present

## 2017-01-15 DIAGNOSIS — D2239 Melanocytic nevi of other parts of face: Secondary | ICD-10-CM | POA: Diagnosis not present

## 2017-01-15 DIAGNOSIS — L918 Other hypertrophic disorders of the skin: Secondary | ICD-10-CM | POA: Diagnosis not present

## 2017-01-23 MED FILL — OMEPRAZOLE DR 40 MG CAPSULE: 40 | 30 days supply | Qty: 30 | Fill #1

## 2017-03-30 ENCOUNTER — Encounter: Payer: Self-pay | Admitting: Family Medicine

## 2017-03-30 ENCOUNTER — Ambulatory Visit: Payer: BLUE CROSS/BLUE SHIELD | Admitting: Family Medicine

## 2017-03-30 VITALS — BP 138/81 | HR 87 | Temp 97.6°F | Ht 69.0 in | Wt 203.0 lb

## 2017-03-30 DIAGNOSIS — T148XXA Other injury of unspecified body region, initial encounter: Secondary | ICD-10-CM | POA: Diagnosis not present

## 2017-03-30 DIAGNOSIS — R1031 Right lower quadrant pain: Secondary | ICD-10-CM

## 2017-03-30 MED ORDER — MELOXICAM 15 MG PO TABS: 7.5000 mg | ORAL_TABLET | Freq: Every day | ORAL | 0 refills | Status: DC

## 2017-03-30 NOTE — Progress Notes (Signed)
Subjective: CC: Abdominal pain PCP: Mechele Claude, MD Bryan Wilson is a 37 y.o. male presenting to clinic today for:  1.  Abdominal pain Patient reports onset of right lower quadrant abdominal pain that he describes as a "pulled muscle" ache.  He notes that the pain was present when he bent over this morning to put a cinderblock in front of his tire.  He denies any associated bulging or masses.  No testicular pain, dysuria, hematuria.  No nausea, vomiting, fevers, diarrhea.  He is having normal bowel movements.  No melena or hematochezia.  He ate [place] last evening but no other persons with similar pain.  On Tuesday, he did pick up about 120 pounds by himself but does not require feeling injured at that time.  He has not used any medications or therapies for this pain.   ROS: Per HPI  No Known Allergies Past Medical History:  Diagnosis Date  . Allergy   . Anxiety   . Depression   . Nephrolithiasis 06/2013    Current Outpatient Medications:  .  cetirizine (ZYRTEC) 10 MG tablet, Take 10 mg by mouth daily., Disp: , Rfl:  .  meloxicam (MOBIC) 15 MG tablet, Take 0.5-1 tablets (7.5-15 mg total) by mouth daily., Disp: 15 tablet, Rfl: 0  Current Facility-Administered Medications:  .  0.9 %  sodium chloride infusion, 500 mL, Intravenous, Continuous, Armbruster, Willaim Rayas, MD Social History   Socioeconomic History  . Marital status: Married    Spouse name: Not on file  . Number of children: 2  . Years of education: Not on file  . Highest education level: Not on file  Occupational History  . Not on file  Social Needs  . Financial resource strain: Not on file  . Food insecurity:    Worry: Not on file    Inability: Not on file  . Transportation needs:    Medical: Not on file    Non-medical: Not on file  Tobacco Use  . Smoking status: Never Smoker  . Smokeless tobacco: Never Used  Substance and Sexual Activity  . Alcohol use: Yes    Alcohol/week: 1.2 - 1.8 oz    Types:  2 - 3 Cans of beer per week    Comment: occ  . Drug use: No  . Sexual activity: Yes    Birth control/protection: Surgical  Lifestyle  . Physical activity:    Days per week: Not on file    Minutes per session: Not on file  . Stress: Not on file  Relationships  . Social connections:    Talks on phone: Not on file    Gets together: Not on file    Attends religious service: Not on file    Active member of club or organization: Not on file    Attends meetings of clubs or organizations: Not on file    Relationship status: Not on file  . Intimate partner violence:    Fear of current or ex partner: Not on file    Emotionally abused: Not on file    Physically abused: Not on file    Forced sexual activity: Not on file  Other Topics Concern  . Not on file  Social History Narrative  . Not on file   Family History  Problem Relation Age of Onset  . Kidney Stones Father   . Alcohol abuse Neg Hx   . Asthma Neg Hx   . Heart disease Neg Hx   . Hyperlipidemia Neg Hx   .  Hypertension Neg Hx   . Cancer Neg Hx     Objective: Office vital signs reviewed. BP 138/81   Pulse 87   Temp 97.6 F (36.4 C) (Oral)   Ht 5\' 9"  (1.753 m)   Wt 203 lb (92.1 kg)   BMI 29.98 kg/m   Physical Examination:  General: Awake, alert, well nourished, well appearing male, No acute distress Cardio: regular rate, +2 DP Pulm: normal work of breathing on room air GI: soft, non-tender to palpation, negative McBurney's, negative Murphy's, negative obturator sign.  He does have some discomfort with activation of the psoas.  No peritoneal signs, non-distended, bowel sounds present x4, no hepatomegaly, no splenomegaly, no masses Extremities: warm, well perfused, No edema, cyanosis or clubbing; +2 pulses bilaterally; some discomfort noted with activation of the psoas on the right. MSK: normal gait and normal station  Assessment/ Plan: 37 y.o. male   1. Right lower quadrant abdominal pain I think this is actually  referred musculoskeletal pain.  His abdominal exam was totally unremarkable.  He is not demonstrating any GI symptoms.  Nothing on exam to suggest appendicitis or any other acute intra-abdominal processes.  No palpable defects within the abdomen to suggest a hernia.  He certainly has no red flag symptoms.  I question iliopsoas tendinitis.  I have prescribed him meloxicam 7.5-15 mg daily as needed with food and a full glass of water.  Website for iliopsoas stretches also provided.  Home care instructions reviewed with patient.  Reasons for emergent evaluation in the emergency department discussed.  He will contact me or follow-up should he have any persistent symptoms or any worsening symptoms.  Should symptoms not improve with the above measures, would consider CT abdomen pelvis for further evaluation.  2. Pulled muscle As above.   Meds ordered this encounter  Medications  . meloxicam (MOBIC) 15 MG tablet    Sig: Take 0.5-1 tablets (7.5-15 mg total) by mouth daily.    Dispense:  15 tablet    Refill:  0    Darwin Rothlisberger Hulen SkainsM Khalel Alms, DO [facility] [phone]

## 2017-03-30 NOTE — Patient Instructions (Signed)
I think you pulled your iliopsoas muscle.  [email or web address]  If you develop fevers chills, nausea, vomiting, blood in your stool, inability to stay hydrated or if the pain gets significantly worse please contact me as this could be signs and symptoms of more significant processes.  There is no evidence of appendicitis or hernia on your exam today.

## 2017-04-25 ENCOUNTER — Encounter: Payer: Self-pay | Admitting: Family Medicine

## 2017-04-25 ENCOUNTER — Ambulatory Visit: Payer: BLUE CROSS/BLUE SHIELD | Admitting: Family Medicine

## 2017-04-25 VITALS — BP 123/83 | HR 72 | Temp 97.8°F | Ht 69.0 in | Wt 198.0 lb

## 2017-04-25 DIAGNOSIS — B9689 Other specified bacterial agents as the cause of diseases classified elsewhere: Secondary | ICD-10-CM

## 2017-04-25 DIAGNOSIS — J019 Acute sinusitis, unspecified: Secondary | ICD-10-CM

## 2017-04-25 MED ORDER — AMOXICILLIN-POT CLAVULANATE 875-125 MG PO TABS: 1.0000 | ORAL_TABLET | Freq: Two times a day (BID) | ORAL | 0 refills | Status: DC

## 2017-04-25 NOTE — Progress Notes (Signed)
Subjective: CC: sinus symptoms PCP: Mechele ClaudeStacks, Warren, MD ZOX:WRUEAVWHPI:Nizar Chanda BusingK Kerstein is a 37 y.o. male presenting to clinic today for:  1. Sinus symptoms  Patient reports sinus pressure, nasal congestion alternating with rhinorrhea, mildly productive cough, chills and joint pain that started on Friday.  He reports that symptoms are worse at night.  He actually thought he was getting slightly better over the weekend but things got worse again on Monday. Denies hemoptysis, headache, SOB, dizziness, rash, nausea, vomiting, fevers, myalgia, sick contacts, recent travel.  Patient has used his Nettie pot, ibuprofen and a couple of tablets of doxycycline with some relief of symptoms.  He takes a daily Allegra tablet for allergy control.  Denies history of COPD or asthma.  No tobacco use/ exposure.    ROS: Per HPI  No Known Allergies Past Medical History:  Diagnosis Date  . Allergy   . Anxiety   . Depression   . Nephrolithiasis 06/2013    Current Outpatient Medications:  .  fexofenadine (ALLEGRA) 180 MG tablet, Take 180 mg by mouth daily., Disp: , Rfl:    Social History   Socioeconomic History  . Marital status: Married    Spouse name: Not on file  . Number of children: 2  . Years of education: Not on file  . Highest education level: Not on file  Occupational History  . Not on file  Social Needs  . Financial resource strain: Not on file  . Food insecurity:    Worry: Not on file    Inability: Not on file  . Transportation needs:    Medical: Not on file    Non-medical: Not on file  Tobacco Use  . Smoking status: Never Smoker  . Smokeless tobacco: Never Used  Substance and Sexual Activity  . Alcohol use: Yes    Alcohol/week: 1.2 - 1.8 oz    Types: 2 - 3 Cans of beer per week    Comment: occ  . Drug use: No  . Sexual activity: Yes    Birth control/protection: Surgical  Lifestyle  . Physical activity:    Days per week: Not on file    Minutes per session: Not on file  . Stress:  Not on file  Relationships  . Social connections:    Talks on phone: Not on file    Gets together: Not on file    Attends religious service: Not on file    Active member of club or organization: Not on file    Attends meetings of clubs or organizations: Not on file    Relationship status: Not on file  . Intimate partner violence:    Fear of current or ex partner: Not on file    Emotionally abused: Not on file    Physically abused: Not on file    Forced sexual activity: Not on file  Other Topics Concern  . Not on file  Social History Narrative  . Not on file   Family History  Problem Relation Age of Onset  . Kidney Stones Father   . Alcohol abuse Neg Hx   . Asthma Neg Hx   . Heart disease Neg Hx   . Hyperlipidemia Neg Hx   . Hypertension Neg Hx   . Cancer Neg Hx     Objective: Office vital signs reviewed. BP 123/83   Pulse 72   Temp 97.8 F (36.6 C) (Oral)   Ht 5\' 9"  (1.753 m)   Wt 198 lb (89.8 kg)   BMI 29.24  kg/m   Physical Examination:  General: Awake, alert, well nourished, No acute distress HEENT: No frontal sinus tenderness to palpation.  +maxillary sinus tenderness to palpation.    Neck: No masses palpated. No lymphadenopathy    Ears: Tympanic membranes intact, normal light reflex, no erythema, no bulging    Eyes: PERRLA, extraocular membranes intact, sclera white    Nose: nasal turbinates moist, moderate nasal discharge; deviated nasal septum noted.    Throat: moist mucus membranes, no erythema, no tonsillar exudate.  Airway is patent Cardio: regular rate and rhythm, S1S2 heard, no murmurs appreciated Pulm: clear to auscultation bilaterally, no wheezes, rhonchi or rales; normal work of breathing on room air MSK: No joint swelling or erythema.  Assessment/ Plan: 37 y.o. male   1. Acute bacterial sinusitis Patient is afebrile nontoxic-appearing with normal vital signs.  Given the waxing and waning of symptoms and predominance of symptoms at nighttime,  will treat empirically for acute bacterial sinusitis with Augmentin 875 p.o. twice daily for the next 10 days.  Home care instructions reviewed with patient.  Continue sinus rinses.  May use Afrin nasal spray for nasal congestion.  Continue antihistamine.  Follow-up as needed.    Meds ordered this encounter  Medications  . amoxicillin-clavulanate (AUGMENTIN) 875-125 MG tablet    Sig: Take 1 tablet by mouth 2 (two) times daily.    Dispense:  20 tablet    Refill:  0     Azarria Balint Hulen Skains, DO [facility] [phone]

## 2017-04-25 NOTE — Patient Instructions (Signed)
- Get plenty of rest and drink plenty of fluids. - Try to breathe moist air. Use a cold mist humidifier. - Consume warm fluids (soup or tea) to provide relief for a stuffy nose and to loosen phlegm. - For nasal stuffiness, try saline nasal spray or a Neti Pot. Afrin nasal spray can also be used but this product should not be used longer than 3 days or it will cause rebound nasal stuffiness (worsening nasal congestion). - For sore throat pain relief: suck on throat lozenges, hard candy or popsicles; gargle with warm salt water (1/4 tsp. salt per 8 oz. of water); and eat soft, bland foods. - Eat a well-balanced diet. If you cannot, ensure you are getting enough nutrients by taking a daily multivitamin. - Avoid dairy products, as they can thicken phlegm. - Avoid alcohol, as it impairs your body's immune system.  CONTACT YOUR DOCTOR IF YOU EXPERIENCE ANY OF THE FOLLOWING: - High fever - Ear pain - Sinus-type headache - Unusually severe cold symptoms - Cough that gets worse while other cold symptoms improve - Flare up of any chronic lung problem, such as asthma - Your symptoms persist longer than 2 weeks    Sinusitis, Adult Sinusitis is soreness and inflammation of your sinuses. Sinuses are hollow spaces in the bones around your face. They are located:  Around your eyes.  In the middle of your forehead.  Behind your nose.  In your cheekbones.  Your sinuses and nasal passages are lined with a stringy fluid (mucus). Mucus normally drains out of your sinuses. When your nasal tissues get inflamed or swollen, the mucus can get trapped or blocked so air cannot flow through your sinuses. This lets bacteria, viruses, and funguses grow, and that leads to infection. Follow these instructions at home: Medicines  Take, use, or apply over-the-counter and prescription medicines only as told by your doctor. These may include nasal sprays.  If you were prescribed an antibiotic medicine, take it as  told by your doctor. Do not stop taking the antibiotic even if you start to feel better. Hydrate and Humidify  Drink enough water to keep your pee (urine) clear or pale yellow.  Use a cool mist humidifier to keep the humidity level in your home above 50%.  Breathe in steam for 10-15 minutes, 3-4 times a day or as told by your doctor. You can do this in the bathroom while a hot shower is running.  Try not to spend time in cool or dry air. Rest  Rest as much as possible.  Sleep with your head raised (elevated).  Make sure to get enough sleep each night. General instructions  Put a warm, moist washcloth on your face 3-4 times a day or as told by your doctor. This will help with discomfort.  Wash your hands often with soap and water. If there is no soap and water, use hand sanitizer.  Do not smoke. Avoid being around people who are smoking (secondhand smoke).  Keep all follow-up visits as told by your doctor. This is important. Contact a doctor if:  You have a fever.  Your symptoms get worse.  Your symptoms do not get better within 10 days. Get help right away if:  You have a very bad headache.  You cannot stop throwing up (vomiting).  You have pain or swelling around your face or eyes.  You have trouble seeing.  You feel confused.  Your neck is stiff.  You have trouble breathing. This information is   not intended to replace advice given to you by your health care provider. Make sure you discuss any questions you have with your health care provider. Document Released: 06/14/2007 Document Revised: 08/22/2015 Document Reviewed: 10/21/2014 Elsevier Interactive Patient Education  2018 [employer]  

## 2017-07-17 ENCOUNTER — Ambulatory Visit (INDEPENDENT_AMBULATORY_CARE_PROVIDER_SITE_OTHER): Payer: BLUE CROSS/BLUE SHIELD | Admitting: Family Medicine

## 2017-07-17 ENCOUNTER — Encounter: Payer: Self-pay | Admitting: Family Medicine

## 2017-07-17 VITALS — BP 113/73 | HR 68 | Temp 98.3°F | Ht 69.0 in | Wt 196.4 lb

## 2017-07-17 DIAGNOSIS — Z0000 Encounter for general adult medical examination without abnormal findings: Secondary | ICD-10-CM | POA: Diagnosis not present

## 2017-07-17 LAB — URINALYSIS
Bilirubin, UA: NEGATIVE
GLUCOSE, UA: NEGATIVE
KETONES UA: NEGATIVE
LEUKOCYTES UA: NEGATIVE
Nitrite, UA: NEGATIVE
PROTEIN UA: NEGATIVE
RBC, UA: NEGATIVE
Specific Gravity, UA: 1.01 (ref 1.005–1.030)
Urobilinogen, Ur: 0.2 mg/dL (ref 0.2–1.0)
pH, UA: 7 (ref 5.0–7.5)

## 2017-07-17 NOTE — Progress Notes (Signed)
Subjective:  Patient ID: Bryan Wilson, male    DOB: 1980/05/25  Age: 37 y.o. MRN: [id number]  CC: Annual Exam   HPI  Bryan Wilson presents for Annual physical  Depression screen Bryan Wilson 2/9 07/17/2017 04/25/2017 07/07/2016  Decreased Interest 0 0 0  Down, Depressed, Hopeless 0 0 0  PHQ - 2 Score 0 0 0    History Bryan Wilson has a past medical history of Allergy, Anxiety, Depression, and Nephrolithiasis (06/2013).   He has a past surgical history that includes Vasectomy.   His family history includes Kidney Stones in his father.He reports that he has never smoked. He has never used smokeless tobacco. He reports that he drinks about 1.2 - 1.8 oz of alcohol per week. He reports that he does not use drugs.    ROS Review of Systems  Constitutional: Negative for activity change, fatigue and unexpected weight change.  HENT: Negative for congestion, ear pain, hearing loss, postnasal drip and trouble swallowing.   Eyes: Negative for pain and visual disturbance.  Respiratory: Negative for cough, chest tightness and shortness of breath.   Cardiovascular: Negative for chest pain, palpitations and leg swelling.  Gastrointestinal: Negative for abdominal distention, abdominal pain, blood in stool, constipation, diarrhea, nausea and vomiting.  Endocrine: Negative for cold intolerance, heat intolerance and polydipsia.  Genitourinary: Negative for difficulty urinating, dysuria, flank pain, frequency and urgency.  Musculoskeletal: Negative for arthralgias and joint swelling.  Skin: Negative for color change, rash and wound.  Neurological: Negative for dizziness, syncope, speech difficulty, weakness, light-headedness, numbness and headaches.  Hematological: Does not bruise/bleed easily.  Psychiatric/Behavioral: Negative for confusion, decreased concentration, dysphoric mood and sleep disturbance. The patient is not nervous/anxious.     Objective:  BP 113/73   Pulse 68   Temp 98.3 F (36.8 C) (Oral)    Ht '5\' 9"'  (1.753 m)   Wt 196 lb 6 oz (89.1 kg)   BMI 29.00 kg/m   BP Readings from Last 3 Encounters:  07/17/17 113/73  04/25/17 123/83  03/30/17 138/81    Wt Readings from Last 3 Encounters:  07/17/17 196 lb 6 oz (89.1 kg)  04/25/17 198 lb (89.8 kg)  03/30/17 203 lb (92.1 kg)     Physical Exam  Constitutional: He is oriented to person, place, and time. He appears well-developed and well-nourished.  HENT:  Head: Normocephalic and atraumatic.  Mouth/Throat: Oropharynx is clear and moist.  Eyes: Pupils are equal, round, and reactive to light. EOM are normal.  Neck: Normal range of motion. No tracheal deviation present. No thyromegaly present.  Cardiovascular: Normal rate, regular rhythm and normal heart sounds. Exam reveals no gallop and no friction rub.  No murmur heard. Pulmonary/Chest: Breath sounds normal. He has no wheezes. He has no rales.  Abdominal: Soft. Bowel sounds are normal. He exhibits no distension and no mass. There is no tenderness. Hernia confirmed negative in the right inguinal area and confirmed negative in the left inguinal area.  Genitourinary: Testes normal and penis normal.  Musculoskeletal: Normal range of motion. He exhibits no edema.  Lymphadenopathy:    He has no cervical adenopathy.  Neurological: He is alert and oriented to person, place, and time.  Skin: Skin is warm and dry.  Psychiatric: He has a normal mood and affect.      Assessment & Plan:   Bryan Wilson was seen today for annual exam.  Diagnoses and all orders for this visit:  Well adult exam -     CBC with Differential/Platelet -  CMP14+EGFR -     Lipid panel -     Urinalysis -     VITAMIN D 25 Hydroxy (Vit-D Deficiency, Fractures)       I have discontinued Imogene Burn. Bryan Wilson's amoxicillin-clavulanate. I am also having him maintain his fexofenadine.  Allergies as of 07/17/2017   No Known Allergies     Medication List        Accurate as of 07/17/17  9:44 AM. Always use  your most recent med list.          fexofenadine 180 MG tablet Commonly known as:  ALLEGRA Take 180 mg by mouth daily.        Follow-up: Return in about 1 year (around 07/18/2018) for Wellness.  Claretta Fraise, M.D.

## 2017-07-17 NOTE — Patient Instructions (Addendum)
USe flonase daily for sinus congestion   Heart-Healthy Eating Plan Heart-healthy meal planning includes:  Limiting unhealthy fats.  Increasing healthy fats.  Making other small dietary changes.  You may need to talk with your doctor or a diet specialist (dietitian) to create an eating plan that is right for you. What types of fat should I choose?  Choose healthy fats. These include olive oil and canola oil, flaxseeds, walnuts, almonds, and seeds.  Eat more omega-3 fats. These include salmon, mackerel, sardines, tuna, flaxseed oil, and ground flaxseeds. Try to eat fish at least twice each week.  Limit saturated fats. ? Saturated fats are often found in animal products, such as meats, butter, and cream. ? Plant sources of saturated fats include palm oil, palm kernel oil, and coconut oil.  Avoid foods with partially hydrogenated oils in them. These include stick margarine, some tub margarines, cookies, crackers, and other baked goods. These contain trans fats. What general guidelines do I need to follow?  Check food labels carefully. Identify foods with trans fats or high amounts of saturated fat.  Fill one half of your plate with vegetables and green salads. Eat 4-5 servings of vegetables per day. A serving of vegetables is: ? 1 cup of raw leafy vegetables. ?  cup of raw or cooked cut-up vegetables. ?  cup of vegetable juice.  Fill one fourth of your plate with whole grains. Look for the word "whole" as the first word in the ingredient list.  Fill one fourth of your plate with lean protein foods.  Eat 4-5 servings of fruit per day. A serving of fruit is: ? One medium whole fruit. ?  cup of dried fruit. ?  cup of fresh, frozen, or canned fruit. ?  cup of 100% fruit juice.  Eat more foods that contain soluble fiber. These include apples, broccoli, carrots, beans, peas, and barley. Try to get 20-30 g of fiber per day.  Eat more home-cooked food. Eat less restaurant,  buffet, and fast food.  Limit or avoid alcohol.  Limit foods high in starch and sugar.  Avoid fried foods.  Avoid frying your food. Try baking, boiling, grilling, or broiling it instead. You can also reduce fat by: ? Removing the skin from poultry. ? Removing all visible fats from meats. ? Skimming the fat off of stews, soups, and gravies before serving them. ? Steaming vegetables in water or broth.  Lose weight if you are overweight.  Eat 4-5 servings of nuts, legumes, and seeds per week: ? One serving of dried beans or legumes equals  cup after being cooked. ? One serving of nuts equals 1 ounces. ? One serving of seeds equals  ounce or one tablespoon.  You may need to keep track of how much salt or sodium you eat. This is especially true if you have high blood pressure. Talk with your doctor or dietitian to get more information. What foods can I eat? Grains Breads, including [place], white, pita, wheat, raisin, rye, oatmeal, and [place]. Tortillas that are neither fried nor made with lard or trans fat. Low-fat rolls, including hotdog and hamburger buns and English muffins. Biscuits. Muffins. Waffles. Pancakes. Light popcorn. Whole-grain cereals. Flatbread. Melba toast. Pretzels. Breadsticks. Rusks. Low-fat snacks. Low-fat crackers, including oyster, saltine, matzo, graham, animal, and rye. Rice and pasta, including brown rice and pastas that are made with whole wheat. Vegetables All vegetables. Fruits All fruits, but limit coconut. Meats and Other Protein Sources Lean, well-trimmed beef, veal, pork, and lamb. Chicken  and [place] without skin. All fish and shellfish. Wild duck, rabbit, pheasant, and venison. Egg whites or low-cholesterol egg substitutes. Dried beans, peas, lentils, and tofu. Seeds and most nuts. Dairy Low-fat or nonfat cheeses, including ricotta, string, and mozzarella. Skim or 1% milk that is liquid, powdered, or evaporated. Buttermilk that is made with low-fat  milk. Nonfat or low-fat yogurt. Beverages Mineral water. Diet carbonated beverages. Sweets and Desserts Sherbets and fruit ices. Honey, jam, marmalade, jelly, and syrups. Meringues and gelatins. Pure sugar candy, such as hard candy, jelly beans, gumdrops, mints, marshmallows, and small amounts of dark chocolate. MGM MIRAGEngel food cake. Eat all sweets and desserts in moderation. Fats and Oils Nonhydrogenated (trans-free) margarines. Vegetable oils, including soybean, sesame, sunflower, olive, peanut, safflower, corn, canola, and cottonseed. Salad dressings or mayonnaise made with a vegetable oil. Limit added fats and oils that you use for cooking, baking, salads, and as spreads. Other Cocoa powder. Coffee and tea. All seasonings and condiments. The items listed above may not be a complete list of recommended foods or beverages. Contact your dietitian for more options. What foods are not recommended? Grains Breads that are made with saturated or trans fats, oils, or whole milk. Croissants. Butter rolls. Cheese breads. Sweet rolls. Donuts. Buttered popcorn. Chow mein noodles. High-fat crackers, such as cheese or butter crackers. Meats and Other Protein Sources Fatty meats, such as hotdogs, short ribs, sausage, spareribs, bacon, rib eye roast or steak, and mutton. High-fat deli meats, such as salami and bologna. Caviar. Domestic duck and goose. Organ meats, such as kidney, liver, sweetbreads, and heart. Dairy Cream, sour cream, cream cheese, and creamed cottage cheese. Whole-milk cheeses, including blue (bleu), [address], [place], [place], [address], [place], [address], cheddar, [place], and [place]. Whole or 2% milk that is liquid, evaporated, or condensed. Whole buttermilk. Cream sauce or high-fat cheese sauce. Yogurt that is made from whole milk. Beverages Regular sodas and juice drinks with added sugar. Sweets and Desserts Frosting. Pudding. Cookies. Cakes other than angel food cake. Candy that has milk  chocolate or white chocolate, hydrogenated fat, butter, coconut, or unknown ingredients. Buttered syrups. Full-fat ice cream or ice cream drinks. Fats and Oils Gravy that has suet, meat fat, or shortening. Cocoa butter, hydrogenated oils, palm oil, coconut oil, palm kernel oil. These can often be found in baked products, candy, fried foods, nondairy creamers, and whipped toppings. Solid fats and shortenings, including bacon fat, salt pork, lard, and butter. Nondairy cream substitutes, such as coffee creamers and sour cream substitutes. Salad dressings that are made of unknown oils, cheese, or sour cream. The items listed above may not be a complete list of foods and beverages to avoid. Contact your dietitian for more information. This information is not intended to replace advice given to you by your health care provider. Make sure you discuss any questions you have with your health care provider. Document Released: 06/27/2011 Document Revised: 06/03/2015 Document Reviewed: 06/19/2013 Elsevier Interactive Patient Education  [employer]2018 Elsevier Inc.

## 2017-07-18 LAB — CBC WITH DIFFERENTIAL/PLATELET
BASOS: 1 %
Basophils Absolute: 0 10*3/uL (ref 0.0–0.2)
EOS (ABSOLUTE): 0.4 10*3/uL (ref 0.0–0.4)
EOS: 10 %
HEMOGLOBIN: 15.2 g/dL (ref 13.0–17.7)
Hematocrit: 43.1 % (ref 37.5–51.0)
IMMATURE GRANS (ABS): 0 10*3/uL (ref 0.0–0.1)
Immature Granulocytes: 0 %
LYMPHS: 34 %
Lymphocytes Absolute: 1.3 10*3/uL (ref 0.7–3.1)
MCH: 29.5 pg (ref 26.6–33.0)
MCHC: 35.3 g/dL (ref 31.5–35.7)
MCV: 84 fL (ref 79–97)
Monocytes Absolute: 0.2 10*3/uL (ref 0.1–0.9)
Monocytes: 5 %
NEUTROS ABS: 1.9 10*3/uL (ref 1.4–7.0)
Neutrophils: 50 %
Platelets: 185 10*3/uL (ref 150–450)
RBC: 5.16 x10E6/uL (ref 4.14–5.80)
RDW: 13.6 % (ref 12.3–15.4)
WBC: 3.7 10*3/uL (ref 3.4–10.8)

## 2017-07-18 LAB — CMP14+EGFR
A/G RATIO: 1.6 (ref 1.2–2.2)
ALBUMIN: 4.7 g/dL (ref 3.5–5.5)
ALT: 20 IU/L (ref 0–44)
AST: 23 IU/L (ref 0–40)
Alkaline Phosphatase: 67 IU/L (ref 39–117)
BILIRUBIN TOTAL: 0.7 mg/dL (ref 0.0–1.2)
BUN/Creatinine Ratio: 7 — ABNORMAL LOW (ref 9–20)
BUN: 7 mg/dL (ref 6–20)
CO2: 25 mmol/L (ref 20–29)
CREATININE: 1.05 mg/dL (ref 0.76–1.27)
Calcium: 9.8 mg/dL (ref 8.7–10.2)
Chloride: 101 mmol/L (ref 96–106)
GFR calc Af Amer: 105 mL/min/{1.73_m2} (ref >59)
GFR calc non Af Amer: 91 mL/min/{1.73_m2} (ref >59)
Globulin, Total: 2.9 g/dL (ref 1.5–4.5)
Glucose: 87 mg/dL (ref 65–99)
Potassium: 4.5 mmol/L (ref 3.5–5.2)
Sodium: 141 mmol/L (ref 134–144)
TOTAL PROTEIN: 7.6 g/dL (ref 6.0–8.5)

## 2017-07-18 LAB — LIPID PANEL
CHOL/HDL RATIO: 4.5 ratio (ref 0.0–5.0)
CHOLESTEROL TOTAL: 206 mg/dL — AB (ref 100–199)
HDL: 46 mg/dL (ref >39)
LDL CALC: 137 mg/dL — AB (ref 0–99)
TRIGLYCERIDES: 116 mg/dL (ref 0–149)
VLDL CHOLESTEROL CAL: 23 mg/dL (ref 5–40)

## 2017-07-18 LAB — VITAMIN D 25 HYDROXY (VIT D DEFICIENCY, FRACTURES): Vit D, 25-Hydroxy: 31.8 ng/mL (ref 30.0–100.0)

## 2017-08-22 MED FILL — TOBRAMYCIN-DEXAMETH OPTH SU: 0.3-0.1 | 14 days supply | Qty: 5 | Fill #0

## 2017-10-23 ENCOUNTER — Other Ambulatory Visit: Payer: Self-pay | Admitting: Gastroenterology

## 2017-10-23 DIAGNOSIS — R1310 Dysphagia, unspecified: Secondary | ICD-10-CM

## 2017-10-23 MED FILL — OMEPRAZOLE 40 MG CPDR: 40 | 90 days supply | Qty: 90 | Fill #0

## 2018-07-22 ENCOUNTER — Encounter: Payer: BLUE CROSS/BLUE SHIELD | Admitting: Family Medicine

## 2018-08-20 ENCOUNTER — Telehealth: Payer: Self-pay | Admitting: Family Medicine

## 2018-08-20 NOTE — Telephone Encounter (Signed)
Patient calls informed that his company has recently implemented mandatory masking.  He voices quite a bit of concern with regard to this as he notes that he has difficulty breathing.  He attributes this to his underlying sinusitis, which is chronic.  He is on an allergy medication to help with this every day but notes that the mask tends to cause increasing sinus congestion and impedes his ability to breathe.  He notes increased anxiety with regards to this which seems to perpetuate the issue.  He is asking that I write a note excusing him from mandatory masking.  He understands the risks of possibly contracting COVID-19 and accepts these risks.

## 2018-11-26 ENCOUNTER — Ambulatory Visit (INDEPENDENT_AMBULATORY_CARE_PROVIDER_SITE_OTHER): Payer: BC Managed Care – PPO | Admitting: Family

## 2018-11-26 ENCOUNTER — Encounter: Payer: Self-pay | Admitting: Family

## 2018-11-26 DIAGNOSIS — J019 Acute sinusitis, unspecified: Secondary | ICD-10-CM

## 2018-11-26 MED ORDER — AMOXICILLIN-POT CLAVULANATE 875-125 MG PO TABS: 1.0000 | ORAL_TABLET | Freq: Two times a day (BID) | ORAL | 0 refills | Status: DC

## 2018-11-26 NOTE — Progress Notes (Signed)
   Virtual Visit via telephone Note Due to COVID-19 pandemic this visit was conducted virtually. This visit type was conducted due to national recommendations for restrictions regarding the COVID-19 Pandemic (e.g. social distancing, sheltering in place) in an effort to limit this patient's exposure and mitigate transmission in our community. All issues noted in this document were discussed and addressed.  A physical exam was not performed with this format.  I connected with Bryan Wilson on 11/26/18 at 12:05 pm by telephone and verified that I am speaking with the correct person using two identifiers. Bryan Wilson is currently located at work and no one is currently with him  during visit. The provider, Evelina Dun, FNP is located in their office at time of visit.  I discussed the limitations, risks, security and privacy concerns of performing an evaluation and management service by telephone and the availability of in person appointments. I also discussed with the patient that there may be a patient responsible charge related to this service. The patient expressed understanding and agreed to proceed.   History and Present Illness:  Sinusitis This is a new problem. The current episode started 1 to 4 weeks ago. The problem has been gradually worsening since onset. There has been no fever. The pain is mild. Associated symptoms include congestion, headaches and sinus pressure. Pertinent negatives include no coughing, ear pain or sore throat. Past treatments include oral decongestants and acetaminophen. The treatment provided mild relief.      Review of Systems  HENT: Positive for congestion and sinus pressure. Negative for ear pain and sore throat.   Respiratory: Negative for cough.   Neurological: Positive for headaches.  All other systems reviewed and are negative.    Observations/Objective: No SOB or distress noted   Assessment and Plan: 1. Acute sinusitis, recurrence not specified,  unspecified location - Take meds as prescribed - Use a cool mist humidifier  -Use saline nose sprays frequently -Force fluids -For any cough or congestion  Use plain Mucinex- regular strength or max strength is fine -For fever or aces or pains- take tylenol or ibuprofen. -Throat lozenges if help -Call if symptoms worsen or do not improve  - amoxicillin-clavulanate (AUGMENTIN) 875-125 MG tablet; Take 1 tablet by mouth 2 (two) times daily.  Dispense: 14 tablet; Refill: 0     I discussed the assessment and treatment plan with the patient. The patient was provided an opportunity to ask questions and all were answered. The patient agreed with the plan and demonstrated an understanding of the instructions.   The patient was advised to call back or seek an in-person evaluation if the symptoms worsen or if the condition fails to improve as anticipated.  The above assessment and management plan was discussed with the patient. The patient verbalized understanding of and has agreed to the management plan. Patient is aware to call the clinic if symptoms persist or worsen. Patient is aware when to return to the clinic for a follow-up visit. Patient educated on when it is appropriate to go to the emergency department.   Time call ended:  12:15 pm   I provided 10 minutes of non-face-to-face time during this encounter.    Evelina Dun, FNP

## 2019-10-15 ENCOUNTER — Other Ambulatory Visit: Payer: Self-pay

## 2019-10-15 ENCOUNTER — Ambulatory Visit (INDEPENDENT_AMBULATORY_CARE_PROVIDER_SITE_OTHER): Payer: BC Managed Care – PPO

## 2019-10-15 ENCOUNTER — Ambulatory Visit: Payer: BC Managed Care – PPO | Admitting: Family Medicine

## 2019-10-15 ENCOUNTER — Encounter: Payer: Self-pay | Admitting: Family Medicine

## 2019-10-15 VITALS — BP 125/79 | HR 66 | Temp 98.4°F | Ht 69.0 in | Wt 196.0 lb

## 2019-10-15 DIAGNOSIS — E7800 Pure hypercholesterolemia, unspecified: Secondary | ICD-10-CM

## 2019-10-15 DIAGNOSIS — Z0001 Encounter for general adult medical examination with abnormal findings: Secondary | ICD-10-CM

## 2019-10-15 DIAGNOSIS — Z0000 Encounter for general adult medical examination without abnormal findings: Secondary | ICD-10-CM

## 2019-10-15 DIAGNOSIS — Z23 Encounter for immunization: Secondary | ICD-10-CM

## 2019-10-15 DIAGNOSIS — Z130 Encounter for screening for diseases of the blood and blood-forming organs and certain disorders involving the immune mechanism: Secondary | ICD-10-CM

## 2019-10-15 MED ORDER — FEXOFENADINE HCL 180 MG PO TABS: 180.0000 mg | ORAL_TABLET | Freq: Every day | ORAL | 3 refills | Status: AC

## 2019-10-15 NOTE — Progress Notes (Signed)
° °  Covid-19 Vaccination Clinic  Name:  Bryan Wilson    MRN: [id number] DOB: [birth year]  10/15/2019  Bryan Wilson was observed post Covid-19 immunization for 15 minutes without incident. He was provided with Vaccine Information Sheet and instruction to access the V-Safe system.   Bryan Wilson was instructed to call 911 with any severe reactions post vaccine:  Difficulty breathing   Swelling of face and throat   A fast heartbeat   A bad rash all over body   Dizziness and weakness   Immunizations Administered    Name Date Dose VIS Date Route   Moderna COVID-19 Vaccine 10/15/2019 11:28 AM 0.5 mL 12/2018 Intramuscular   Manufacturer: Moderna   Lot: [id number]   NDC: 57017-793-90

## 2019-10-15 NOTE — Progress Notes (Signed)
Bryan Wilson is a 39 y.o. male presents to office today for annual physical exam examination.    Concerns today include: 1.  None.  He wants to get his first Covid shot if possible  Occupation: Patent examiner, Marital status: married, Substance use: social alcohol Diet: Fair.  Eats lean meats like the year, Exercise: No structured but is planning on starting a structured physical activity regimen soon Last eye exam: Does not see an eye doctor as he has no vision problems Last dental exam: Every 6 months Refills needed today: N/A Immunizations needed: Declines influenza but will take it first COVID-19 shot today.  Of note he has had COVID-19 infection previously  Past Medical History:  Diagnosis Date   Allergy    Anxiety    Depression    Nephrolithiasis 06/2013   Social History   Socioeconomic History   Marital status: Married    Spouse name: Not on file   Number of children: 2   Years of education: Not on file   Highest education level: Not on file  Occupational History   Not on file  Tobacco Use   Smoking status: Never Smoker   Smokeless tobacco: Never Used  Substance and Sexual Activity   Alcohol use: Yes    Alcohol/week: 12.0 standard drinks    Types: 12 Cans of beer per week   Drug use: No   Sexual activity: Yes    Birth control/protection: Surgical  Other Topics Concern   Not on file  Social History Narrative   Works at [employer], married to a nurse Bryan Wilson   Enjoys hunting   Social Determinants of Radio broadcast assistant Strain:    Difficulty of Paying Living Expenses: Not on file  Food Insecurity:    Worried About Charity fundraiser in the Last Year: Not on file   [employer] of Food in the Last Year: Not on file  Transportation Needs:    Lack of Transportation (Medical): Not on file   Lack of Transportation (Non-Medical): Not on file  Physical Activity:    Days of Exercise per Week: Not on file   Minutes of Exercise  per Session: Not on file  Stress:    Feeling of Stress : Not on file  Social Connections:    Frequency of Communication with Friends and Family: Not on file   Frequency of Social Gatherings with Friends and Family: Not on file   Attends Religious Services: Not on file   Active Member of Clubs or Organizations: Not on file   Attends Archivist Meetings: Not on file   Marital Status: Not on file  Intimate Partner Violence:    Fear of Current or Ex-Partner: Not on file   Emotionally Abused: Not on file   Physically Abused: Not on file   Sexually Abused: Not on file   Past Surgical History:  Procedure Laterality Date   VASECTOMY     Family History  Problem Relation Age of Onset   Kidney Stones Father    Hyperlipidemia Father    Alcohol abuse Neg Hx    Asthma Neg Hx    Heart disease Neg Hx    Hypertension Neg Hx    Cancer Neg Hx     Current Outpatient Medications:    fexofenadine (ALLEGRA) 180 MG tablet, Take 1 tablet (180 mg total) by mouth daily. If ins does not cover, he will get OTC if cheaper., Disp: 90 tablet, Rfl: 3  No  Known Allergies   Immunization History  Administered Date(s) Administered   Influenza,inj,Quad PF,6+ Mos 10/18/2012   Influenza-Unspecified 11/17/2013, 11/13/2014, 11/22/2015, 11/13/2016   Moderna SARS-COVID-2 Vaccination 10/15/2019   Tdap 10/22/2013   ROS: Review of Systems Pertinent items noted in HPI and remainder of comprehensive ROS otherwise negative.    Physical exam BP 125/79    Pulse 66    Temp 98.4 F (36.9 C) (Temporal)    Ht $R'5\' 9"'At$  (1.753 m)    Wt 196 lb (88.9 kg)    SpO2 99%    BMI 28.94 kg/m  General appearance: alert, cooperative, appears stated age and no distress Head: Normocephalic, without obvious abnormality, atraumatic Eyes: negative findings: lids and lashes normal, conjunctivae and sclerae normal, corneas clear and pupils equal, round, reactive to light and accomodation Ears: normal TM's.   Small hemostatic abrasion noted along the anterior aspect of the left external auditory canal Nose: Nares normal. Septum midline. Mucosa normal. No drainage or sinus tenderness. Throat: lips, mucosa, and tongue normal; teeth and gums normal Neck: no adenopathy, no carotid bruit, supple, symmetrical, trachea midline and thyroid not enlarged, symmetric, no tenderness/mass/nodules Back: symmetric, no curvature. ROM normal. No CVA tenderness. Lungs: clear to auscultation bilaterally Chest wall: no tenderness Heart: regular rate and rhythm, S1, S2 normal, no murmur, click, rub or gallop Abdomen: soft, non-tender; bowel sounds normal; no masses,  no organomegaly Extremities: extremities normal, atraumatic, no cyanosis or edema Pulses: 2+ and symmetric Skin: Skin color, texture, turgor normal. No rashes or lesions Lymph nodes: Cervical, supraclavicular, and axillary nodes normal. Neurologic: Alert and oriented X 3, normal strength and tone. Normal symmetric reflexes. Normal coordination and gait Psych: Mood stable, speech normal, affect appropriate, pleasant and interactive. Depression screen [facility] 2/9 10/15/2019 07/17/2017 04/25/2017  Decreased Interest 0 0 0  Down, Depressed, Hopeless 0 0 0  PHQ - 2 Score 0 0 0  Altered sleeping 0 - -  Tired, decreased energy 0 - -  Change in appetite 0 - -  Feeling bad or failure about yourself  0 - -  Trouble concentrating 0 - -  Moving slowly or fidgety/restless 0 - -  Suicidal thoughts 0 - -  PHQ-9 Score 0 - -   Assessment/ Plan: Bryan Wilson here for annual physical exam.   1. Annual physical exam Healthy physical.  I agree that he may start a physical exercise regimen.  No restrictions.  Recommend graduated exercise program if he has been fairly sedentary.  Reinforce Mediterranean diet with lots of fruits and vegetables, low simple sugars and lean meats.  His first COVID-19 shot was administered.  We discussed the potential side effects of this,  particularly in the setting of previous COVID-19 infection.  Reviewed current CDC recommendations within the [place] as well as data coming in from outside of the [place].  2. Pure hypercholesterolemia Fasting labs were obtained today.  He has had issues with high LDLs in the past.  There is a family history on the maternal grandfather side of early cardiovascular disease that ultimately led to his grandfather's death.  Mildly elevated BMI as his only other risk factor for cardiovascular disease.  We discussed that if there is a strong family history of early cardiovascular disease and LDL is above 60, statin should be considered.  In the interim, totally agree with pursuing lifestyle modification to reduce cardiovascular risk. - CMP14+EGFR - Lipid Panel - TSH  3. Screening, anemia, deficiency, iron - CBC   Counseled on  healthy lifestyle choices, including diet (rich in fruits, vegetables and lean meats and low in salt and simple carbohydrates) and exercise (at least 30 minutes of moderate physical activity daily).  Patient to follow up in 1 year for annual exam or sooner if needed.  Adin Laker M. Lajuana Ripple, DO

## 2019-10-15 NOTE — Patient Instructions (Addendum)
You had labs performed today.  You will be contacted with the results of the labs once they are available, usually in the next 3 business days for routine lab work.  If you have an active my chart account, they will be released to your MyChart.  If you prefer to have these labs released to you via telephone, please let us know.  If you had a pap smear or biopsy performed, expect to be contacted in about 7-10 days.   Preventive Care 69-39 Years Old, Male Preventive care refers to lifestyle choices and visits with your health care provider that can promote health and wellness. This includes:  A yearly physical exam. This is also called an annual well check.  Regular dental and eye exams.  Immunizations.  Screening for certain conditions.  Healthy lifestyle choices, such as eating a healthy diet, getting regular exercise, not using drugs or products that contain nicotine and tobacco, and limiting alcohol use. What can I expect for my preventive care visit? Physical exam Your health care provider will check:  Height and weight. These may be used to calculate body mass index (BMI), which is a measurement that tells if you are at a healthy weight.  Heart rate and blood pressure.  Your skin for abnormal spots. Counseling Your health care provider may ask you questions about:  Alcohol, tobacco, and drug use.  Emotional well-being.  Home and relationship well-being.  Sexual activity.  Eating habits.  Work and work Statistician. What immunizations do I need?  Influenza (flu) vaccine  This is recommended every year. Tetanus, diphtheria, and pertussis (Tdap) vaccine  You may need a Td booster every 10 years. Varicella (chickenpox) vaccine  You may need this vaccine if you have not already been vaccinated. Human papillomavirus (HPV) vaccine  If recommended by your health care provider, you may need three doses over 6 months. Measles, mumps, and rubella (MMR) vaccine  You may  need at least one dose of MMR. You may also need a second dose. Meningococcal conjugate (MenACWY) vaccine  One dose is recommended if you are 25-14 years old and a Market researcher living in a residence hall, or if you have one of several medical conditions. You may also need additional booster doses. Pneumococcal conjugate (PCV13) vaccine  You may need this if you have certain conditions and were not previously vaccinated. Pneumococcal polysaccharide (PPSV23) vaccine  You may need one or two doses if you smoke cigarettes or if you have certain conditions. Hepatitis A vaccine  You may need this if you have certain conditions or if you travel or work in places where you may be exposed to hepatitis A. Hepatitis B vaccine  You may need this if you have certain conditions or if you travel or work in places where you may be exposed to hepatitis B. Haemophilus influenzae type b (Hib) vaccine  You may need this if you have certain risk factors. You may receive vaccines as individual doses or as more than one vaccine together in one shot (combination vaccines). Talk with your health care provider about the risks and benefits of combination vaccines. What tests do I need? Blood tests  Lipid and cholesterol levels. These may be checked every 5 years starting at age 59.  Hepatitis C test.  Hepatitis B test. Screening   Diabetes screening. This is done by checking your blood sugar (glucose) after you have not eaten for a while (fasting).  Sexually transmitted disease (STD) testing. Talk with your health  care provider about your test results, treatment options, and if necessary, the need for more tests. Follow these instructions at home: Eating and drinking   Eat a diet that includes fresh fruits and vegetables, whole grains, lean protein, and low-fat dairy products.  Take vitamin and mineral supplements as recommended by your health care provider.  Do not drink alcohol if  your health care provider tells you not to drink.  If you drink alcohol: ? Limit how much you have to 0-2 drinks a day. ? Be aware of how much alcohol is in your drink. In the [place] one drink equals one 12 oz bottle of beer (355 mL), one 5 oz glass of wine (148 mL), or one 1 oz glass of hard liquor (44 mL). Lifestyle  Take daily care of your teeth and gums.  Stay active. Exercise for at least 30 minutes on 5 or more days each week.  Do not use any products that contain nicotine or tobacco, such as cigarettes, e-cigarettes, and chewing tobacco. If you need help quitting, ask your health care provider.  If you are sexually active, practice safe sex. Use a condom or other form of protection to prevent STIs (sexually transmitted infections). What's next?  Go to your health care provider once a year for a well check visit.  Ask your health care provider how often you should have your eyes and teeth checked.  Stay up to date on all vaccines. This information is not intended to replace advice given to you by your health care provider. Make sure you discuss any questions you have with your health care provider. Document Revised: 12/20/2017 Document Reviewed: 12/20/2017 Elsevier Patient Education  2020 [employer].

## 2019-10-16 LAB — CBC
Hematocrit: 42.8 % (ref 37.5–51.0)
Hemoglobin: 15.1 g/dL (ref 13.0–17.7)
MCH: 30.3 pg (ref 26.6–33.0)
MCHC: 35.3 g/dL (ref 31.5–35.7)
MCV: 86 fL (ref 79–97)
Platelets: 219 10*3/uL (ref 150–450)
RBC: 4.99 x10E6/uL (ref 4.14–5.80)
RDW: 12.4 % (ref 11.6–15.4)
WBC: 3.9 10*3/uL (ref 3.4–10.8)

## 2019-10-16 LAB — CMP14+EGFR
ALT: 16 IU/L (ref 0–44)
AST: 22 IU/L (ref 0–40)
Albumin/Globulin Ratio: 1.8 (ref 1.2–2.2)
Albumin: 4.8 g/dL (ref 4.0–5.0)
Alkaline Phosphatase: 67 IU/L (ref 44–121)
BUN/Creatinine Ratio: 11 (ref 9–20)
BUN: 12 mg/dL (ref 6–20)
Bilirubin Total: 0.8 mg/dL (ref 0.0–1.2)
CO2: 24 mmol/L (ref 20–29)
Calcium: 9.5 mg/dL (ref 8.7–10.2)
Chloride: 101 mmol/L (ref 96–106)
Creatinine, Ser: 1.07 mg/dL (ref 0.76–1.27)
GFR calc Af Amer: 101 mL/min/{1.73_m2} (ref >59)
GFR calc non Af Amer: 87 mL/min/{1.73_m2} (ref >59)
Globulin, Total: 2.6 g/dL (ref 1.5–4.5)
Glucose: 87 mg/dL (ref 65–99)
Potassium: 4.1 mmol/L (ref 3.5–5.2)
Sodium: 142 mmol/L (ref 134–144)
Total Protein: 7.4 g/dL (ref 6.0–8.5)

## 2019-10-16 LAB — LIPID PANEL
Chol/HDL Ratio: 4.7 ratio (ref 0.0–5.0)
Cholesterol, Total: 201 mg/dL — ABNORMAL HIGH (ref 100–199)
HDL: 43 mg/dL (ref >39)
LDL Chol Calc (NIH): 140 mg/dL — ABNORMAL HIGH (ref 0–99)
Triglycerides: 99 mg/dL (ref 0–149)
VLDL Cholesterol Cal: 18 mg/dL (ref 5–40)

## 2019-10-16 LAB — TSH: TSH: 2.79 u[IU]/mL (ref 0.450–4.500)

## 2019-11-12 ENCOUNTER — Ambulatory Visit (INDEPENDENT_AMBULATORY_CARE_PROVIDER_SITE_OTHER): Payer: BC Managed Care – PPO

## 2019-11-12 ENCOUNTER — Other Ambulatory Visit: Payer: Self-pay

## 2019-11-12 DIAGNOSIS — Z23 Encounter for immunization: Secondary | ICD-10-CM

## 2019-11-12 NOTE — Progress Notes (Signed)
° °  Covid-19 Vaccination Clinic  Name:  ANGELOS WASCO    MRN: [id number] DOB: 1980/09/11  11/12/2019  Mr. Nuzzo was observed post Covid-19 immunization for 15 minutes without incident. He was provided with Vaccine Information Sheet and instruction to access the V-Safe system.   Mr. Sacks was instructed to call 911 with any severe reactions post vaccine:  Difficulty breathing   Swelling of face and throat   A fast heartbeat   A bad rash all over body   Dizziness and weakness   Immunizations Administered    Name Date Dose VIS Date Route   Moderna COVID-19 Vaccine 11/12/2019  2:17 PM 0.5 mL 10/29/2019 Intramuscular   Manufacturer: Moderna   Lot: [id number]   NDC: 43276-147-09

## 2020-04-13 ENCOUNTER — Other Ambulatory Visit (HOSPITAL_COMMUNITY): Payer: Self-pay

## 2020-04-13 ENCOUNTER — Other Ambulatory Visit: Payer: Self-pay | Admitting: Family Medicine

## 2020-04-13 DIAGNOSIS — K200 Eosinophilic esophagitis: Secondary | ICD-10-CM | POA: Insufficient documentation

## 2020-04-13 MED ORDER — OMEPRAZOLE 40 MG PO CPDR
40.0000 mg | DELAYED_RELEASE_CAPSULE | Freq: Every day | ORAL | 3 refills | Status: DC
  Filled 2020-04-13: qty 90, 90d supply, fill #0
  Filled 2021-03-26: qty 90, 90d supply, fill #1

## 2020-04-16 ENCOUNTER — Other Ambulatory Visit (HOSPITAL_COMMUNITY): Payer: Self-pay

## 2020-06-24 ENCOUNTER — Other Ambulatory Visit (HOSPITAL_COMMUNITY): Payer: Self-pay

## 2020-10-25 ENCOUNTER — Encounter: Payer: Self-pay | Admitting: Family Medicine

## 2020-10-25 ENCOUNTER — Ambulatory Visit: Payer: BC Managed Care – PPO | Admitting: Family Medicine

## 2020-10-25 DIAGNOSIS — J019 Acute sinusitis, unspecified: Secondary | ICD-10-CM

## 2020-10-25 MED ORDER — AMOXICILLIN 875 MG PO TABS: 875.0000 mg | ORAL_TABLET | Freq: Two times a day (BID) | ORAL | 0 refills | Status: AC

## 2020-10-25 NOTE — Progress Notes (Signed)
   Virtual Visit  Note Due to COVID-19 pandemic this visit was conducted virtually. This visit type was conducted due to national recommendations for restrictions regarding the COVID-19 Pandemic (e.g. social distancing, sheltering in place) in an effort to limit this patient's exposure and mitigate transmission in our community. All issues noted in this document were discussed and addressed.  A physical exam was not performed with this format.  I connected with Bryan Wilson on 10/25/20 at 0808 by telephone and verified that I am speaking with the correct person using two identifiers. Bryan Wilson is currently located at work and no one is currently with him during the visit. The provider, Gabriel Earing, FNP is located in their office at time of visit.  I discussed the limitations, risks, security and privacy concerns of performing an evaluation and management service by telephone and the availability of in person appointments. I also discussed with the patient that there may be a patient responsible charge related to this service. The patient expressed understanding and agreed to proceed.  CC: sinusitis  History and Present Illness:  HPI Taseen reports sinusitis symptoms x 10 days. He reports congestion, postnasal drip, and sore throat. He started having chills yesterday. He denies fever, nausea, vomiting, diarrhea, body aches, shortness of breath, or chest pain. He has taken allegra and OTC decongestants without improvement.     ROS As per HPI.   Observations/Objective: Alert and oriented x 3. Able to speak in full sentences without difficulty.   Assessment and Plan: [name] was seen today for sinusitis.  Diagnoses and all orders for this visit:  Acute non-recurrent sinusitis, unspecified location Declined Covid testing today. Amoxicillin as below. Discussed symptomatic care and return precautions.  -     amoxicillin (AMOXIL) 875 MG tablet; Take 1 tablet (875 mg total) by mouth  2 (two) times daily for 7 days.    Follow Up Instructions: As needed.    I discussed the assessment and treatment plan with the patient. The patient was provided an opportunity to ask questions and all were answered. The patient agreed with the plan and demonstrated an understanding of the instructions.   The patient was advised to call back or seek an in-person evaluation if the symptoms worsen or if the condition fails to improve as anticipated.  The above assessment and management plan was discussed with the patient. The patient verbalized understanding of and has agreed to the management plan. Patient is aware to call the clinic if symptoms persist or worsen. Patient is aware when to return to the clinic for a follow-up visit. Patient educated on when it is appropriate to go to the emergency department.   Time call ended:  0819  I provided 11 minutes of  non face-to-face time during this encounter.    Gabriel Earing, FNP

## 2020-11-24 DIAGNOSIS — M62830 Muscle spasm of back: Secondary | ICD-10-CM | POA: Diagnosis not present

## 2020-11-24 DIAGNOSIS — M9903 Segmental and somatic dysfunction of lumbar region: Secondary | ICD-10-CM | POA: Diagnosis not present

## 2020-11-25 DIAGNOSIS — M9904 Segmental and somatic dysfunction of sacral region: Secondary | ICD-10-CM | POA: Diagnosis not present

## 2020-11-25 DIAGNOSIS — M9903 Segmental and somatic dysfunction of lumbar region: Secondary | ICD-10-CM | POA: Diagnosis not present

## 2020-11-25 DIAGNOSIS — M9905 Segmental and somatic dysfunction of pelvic region: Secondary | ICD-10-CM | POA: Diagnosis not present

## 2020-11-25 DIAGNOSIS — M62830 Muscle spasm of back: Secondary | ICD-10-CM | POA: Diagnosis not present

## 2020-11-29 DIAGNOSIS — M9904 Segmental and somatic dysfunction of sacral region: Secondary | ICD-10-CM | POA: Diagnosis not present

## 2020-11-29 DIAGNOSIS — M9903 Segmental and somatic dysfunction of lumbar region: Secondary | ICD-10-CM | POA: Diagnosis not present

## 2020-11-29 DIAGNOSIS — M9905 Segmental and somatic dysfunction of pelvic region: Secondary | ICD-10-CM | POA: Diagnosis not present

## 2020-11-29 DIAGNOSIS — M62830 Muscle spasm of back: Secondary | ICD-10-CM | POA: Diagnosis not present

## 2020-12-01 DIAGNOSIS — M62830 Muscle spasm of back: Secondary | ICD-10-CM | POA: Diagnosis not present

## 2020-12-01 DIAGNOSIS — M9903 Segmental and somatic dysfunction of lumbar region: Secondary | ICD-10-CM | POA: Diagnosis not present

## 2020-12-01 DIAGNOSIS — M9904 Segmental and somatic dysfunction of sacral region: Secondary | ICD-10-CM | POA: Diagnosis not present

## 2020-12-01 DIAGNOSIS — M9905 Segmental and somatic dysfunction of pelvic region: Secondary | ICD-10-CM | POA: Diagnosis not present

## 2020-12-06 DIAGNOSIS — M9905 Segmental and somatic dysfunction of pelvic region: Secondary | ICD-10-CM | POA: Diagnosis not present

## 2020-12-06 DIAGNOSIS — M9903 Segmental and somatic dysfunction of lumbar region: Secondary | ICD-10-CM | POA: Diagnosis not present

## 2020-12-06 DIAGNOSIS — M9904 Segmental and somatic dysfunction of sacral region: Secondary | ICD-10-CM | POA: Diagnosis not present

## 2020-12-06 DIAGNOSIS — M62830 Muscle spasm of back: Secondary | ICD-10-CM | POA: Diagnosis not present

## 2020-12-08 ENCOUNTER — Ambulatory Visit (INDEPENDENT_AMBULATORY_CARE_PROVIDER_SITE_OTHER): Payer: BC Managed Care – PPO | Admitting: Family Medicine

## 2020-12-08 ENCOUNTER — Encounter: Payer: Self-pay | Admitting: Family Medicine

## 2020-12-08 ENCOUNTER — Other Ambulatory Visit: Payer: Self-pay

## 2020-12-08 ENCOUNTER — Other Ambulatory Visit (HOSPITAL_COMMUNITY): Payer: Self-pay

## 2020-12-08 VITALS — BP 118/78 | HR 75 | Temp 98.2°F | Ht 69.0 in | Wt 204.8 lb

## 2020-12-08 DIAGNOSIS — Z0000 Encounter for general adult medical examination without abnormal findings: Secondary | ICD-10-CM

## 2020-12-08 DIAGNOSIS — Z125 Encounter for screening for malignant neoplasm of prostate: Secondary | ICD-10-CM

## 2020-12-08 DIAGNOSIS — L819 Disorder of pigmentation, unspecified: Secondary | ICD-10-CM

## 2020-12-08 DIAGNOSIS — Z0001 Encounter for general adult medical examination with abnormal findings: Secondary | ICD-10-CM | POA: Diagnosis not present

## 2020-12-08 DIAGNOSIS — Z6830 Body mass index (BMI) 30.0-30.9, adult: Secondary | ICD-10-CM

## 2020-12-08 DIAGNOSIS — E7800 Pure hypercholesterolemia, unspecified: Secondary | ICD-10-CM

## 2020-12-08 DIAGNOSIS — R0981 Nasal congestion: Secondary | ICD-10-CM

## 2020-12-08 MED ORDER — MOMETASONE FUROATE 50 MCG/ACT NA SUSP
2.0000 | Freq: Every day | NASAL | 12 refills | Status: DC
  Filled 2020-12-08: qty 17, 30d supply, fill #0

## 2020-12-08 NOTE — Progress Notes (Signed)
Bryan Wilson is a 40 y.o. male presents to office today for annual physical exam examination.    Concerns today include: 1.  Skin lesions Patient with 2 different skin lesions of concern, one on the right anterior chest which his wife feels is different since he sustained a severe sunburn in [place] this past summer.  He had a sunburn that was so severe that his skin blistered in several places.  The lesion on the right anterior chest now also flakes and cracks.  It is irregularly colored.  No known family history of melanoma.  He has previously seen Dr. Ronnald Wilson in [place] for skin examinations and would like to revisit him  He has another lesion on the right upper back that is very pigmented and again brought concern to his wife.  He denies any spontaneous bleeding.  Unsure changes he cannot see this 1 well  Occupation: Patent examiner, Marital status: married, Substance use: social ETOH, former chewing tobacco Diet: typical [place], Exercise: no structured Last eye exam: needs Last dental exam: UTD Last colonoscopy: n/a Refills needed today: n/a Immunizations needed: Immunization History  Administered Date(s) Administered   Influenza,inj,Quad PF,6+ Mos 10/18/2012   Influenza-Unspecified 11/17/2013, 11/13/2014, 11/22/2015, 11/13/2016   Moderna Sars-Covid-2 Vaccination 10/15/2019, 11/12/2019   Tdap 10/22/2013     Past Medical History:  Diagnosis Date   Allergy    Anxiety    Depression    Nephrolithiasis 06/2013   Social History   Socioeconomic History   Marital status: Married    Spouse name: Not on file   Number of children: 2   Years of education: Not on file   Highest education level: Not on file  Occupational History   Not on file  Tobacco Use   Smoking status: Never   Smokeless tobacco: Never  Substance and Sexual Activity   Alcohol use: Yes    Alcohol/week: 12.0 standard drinks    Types: 12 Cans of beer per week   Drug use: No   Sexual activity: Yes     Birth control/protection: Surgical  Other Topics Concern   Not on file  Social History Narrative   Works at [employer], married to a Teacher, English as a foreign language   Enjoys hunting   Social Determinants of Radio broadcast assistant Strain: Not on file  Food Insecurity: Not on file  Transportation Needs: Not on file  Physical Activity: Not on file  Stress: Not on file  Social Connections: Not on file  Intimate Partner Violence: Not on file   Past Surgical History:  Procedure Laterality Date   VASECTOMY     Family History  Problem Relation Age of Onset   Kidney Stones Father    Hyperlipidemia Father    Heart attack Maternal Grandfather 12   Alcohol abuse Neg Hx    Asthma Neg Hx    Heart disease Neg Hx    Hypertension Neg Hx    Cancer Neg Hx     Current Outpatient Medications:    fexofenadine (ALLEGRA) 180 MG tablet, Take 1 tablet (180 mg total) by mouth daily. If ins does not cover, he will get OTC if cheaper., Disp: 90 tablet, Rfl: 3   omeprazole (PRILOSEC) 40 MG capsule, Take 1 capsule (40 mg total) by mouth daily., Disp: 90 capsule, Rfl: 3  Allergies  Allergen Reactions   Shellfish Allergy      ROS: Review of Systems Eyes: Some difficulty with night vision, specifically in the rain Ears, nose, mouth, throat, and  face: positive for nasal congestion Integument/breast: positive for skin lesion(s) Musculoskeletal:positive for recent flareup of back pain that is since resolved    Physical exam BP 118/78   Pulse 75   Temp 98.2 F (36.8 C)   Ht '5\' 9"'  (1.753 m)   Wt 204 lb 12.8 oz (92.9 kg)   SpO2 96%   BMI 30.24 kg/m  General appearance: alert, cooperative, appears stated age, and no distress Head: Normocephalic, without obvious abnormality, atraumatic Eyes: negative findings: lids and lashes normal, conjunctivae and sclerae normal, corneas clear, and pupils equal, round, reactive to light and accomodation Ears: normal TM's and external ear canals both ears Nose: Nares  normal. Septum deviated. Mucosa normal. No drainage or sinus tenderness. Throat: lips, mucosa, and tongue normal; teeth and gums normal Neck: no adenopathy, no carotid bruit, no JVD, supple, symmetrical, trachea midline, and thyroid not enlarged, symmetric, no tenderness/mass/nodules Back: symmetric, no curvature. ROM normal. No CVA tenderness. Lungs: clear to auscultation bilaterally Chest wall: no tenderness Heart: regular rate and rhythm, S1, S2 normal, no murmur, click, rub or gallop Abdomen: soft, non-tender; bowel sounds normal; no masses,  no organomegaly Extremities: extremities normal, atraumatic, no cyanosis or edema Pulses: 2+ and symmetric Skin:  Skin lesion #1: right anterior shoulder/chest 71mx3mm with irregular borders, flaking and asymmetric pigmentation Skin lesion #2: Right mid back 314m2mm with very dark pigmentation and slightly irregular borders Skin lesion #3: Mid to low back 1 CM [id number] irregular pigmentation and irregular borders Lymph nodes: Cervical, supraclavicular, and axillary nodes normal. Neurologic: Alert and oriented X 3, normal strength and tone. Normal symmetric reflexes. Normal coordination and gait Psych: Mood stable, patient very pleasant and interactive Flowsheet Row Office [place]/08/[place] [facility] Total Score 0      Assessment/ Plan: MatDonald Wilson for annual physical exam.   Annual physical exam  Pure hypercholesterolemia - Plan: Lipid panel, CMP14+EGFR, TSH  BMI 30.0-30.9,adult  Screening for malignant neoplasm of prostate - Plan: PSA  Pigmented skin lesion - Plan: Ambulatory referral to Dermatology  Patient is up-to-date on preventative health care.  We will have fasting labs obtained at a future date.  Reinforced high-fiber diet with plenty of water.  No known risk factors for prostate cancers and he is totally asymptomatic so doubt any BPH but we will obtain a baseline level now  that he is 40.  He has several pigmented skin lesions, 2 of which I would like to be evaluated in particular.  He had quite a severe sunburn that occurred this past year and I think that he would benefit from full-body exam.  He has subsequently [facility] healthy lifestyle choices, including diet (rich in fruits, vegetables and lean meats and low in salt and simple carbohydrates) and exercise (at least 30 minutes of moderate physical activity daily).  Patient to follow up in 1 year for annual exam or sooner if needed.  Anquanette Bahner M. GotLajuana RippleO

## 2020-12-08 NOTE — Patient Instructions (Signed)
Fasting labs.  Preventive Care 88-40 Years Old, Male Preventive care refers to lifestyle choices and visits with your health care provider that can promote health and wellness. Preventive care visits are also called wellness exams. What can I expect for my preventive care visit? Counseling During your preventive care visit, your health care provider may ask about your: Medical history, including: Past medical problems. Family medical history. Current health, including: Emotional well-being. Home life and relationship well-being. Sexual activity. Lifestyle, including: Alcohol, nicotine or tobacco, and drug use. Access to firearms. Diet, exercise, and sleep habits. Safety issues such as seatbelt and bike helmet use. Sunscreen use. Work and work Astronomer. Physical exam Your health care provider will check your: Height and weight. These may be used to calculate your BMI (body mass index). BMI is a measurement that tells if you are at a healthy weight. Waist circumference. This measures the distance around your waistline. This measurement also tells if you are at a healthy weight and may help predict your risk of certain diseases, such as type 2 diabetes and high blood pressure. Heart rate and blood pressure. Body temperature. Skin for abnormal spots. What immunizations do I need? Vaccines are usually given at various ages, according to a schedule. Your health care provider will recommend vaccines for you based on your age, medical history, and lifestyle or other factors, such as travel or where you work. What tests do I need? Screening Your health care provider may recommend screening tests for certain conditions. This may include: Lipid and cholesterol levels. Diabetes screening. This is done by checking your blood sugar (glucose) after you have not eaten for a while (fasting). Hepatitis B test. Hepatitis C test. HIV (human immunodeficiency virus) test. STI (sexually transmitted  infection) testing, if you are at risk. Lung cancer screening. Prostate cancer screening. Colorectal cancer screening. Talk with your health care provider about your test results, treatment options, and if necessary, the need for more tests. Follow these instructions at home: Eating and drinking  Eat a diet that includes fresh fruits and vegetables, whole grains, lean protein, and low-fat dairy products. Take vitamin and mineral supplements as recommended by your health care provider. Do not drink alcohol if your health care provider tells you not to drink. If you drink alcohol: Limit how much you have to 0-2 drinks a day. Know how much alcohol is in your drink. In the [place] one drink equals one 12 oz bottle of beer (355 mL), one 5 oz glass of wine (148 mL), or one 1 oz glass of hard liquor (44 mL). Lifestyle Brush your teeth every morning and night with fluoride toothpaste. Floss one time each day. Exercise for at least 30 minutes 5 or more days each week. Do not use any products that contain nicotine or tobacco. These products include cigarettes, chewing tobacco, and vaping devices, such as e-cigarettes. If you need help quitting, ask your health care provider. Do not use drugs. If you are sexually active, practice safe sex. Use a condom or other form of protection to prevent STIs. Take aspirin only as told by your health care provider. Make sure that you understand how much to take and what form to take. Work with your health care provider to find out whether it is safe and beneficial for you to take aspirin daily. Find healthy ways to manage stress, such as: Meditation, yoga, or listening to music. Journaling. Talking to a trusted person. Spending time with friends and family. Minimize exposure to UV radiation  to reduce your risk of skin cancer. Safety Always wear your seat belt while driving or riding in a vehicle. Do not drive: If you have been drinking alcohol. Do not ride with  someone who has been drinking. When you are tired or distracted. While texting. If you have been using any mind-altering substances or drugs. Wear a helmet and other protective equipment during sports activities. If you have firearms in your house, make sure you follow all gun safety procedures. What's next? Go to your health care provider once a year for an annual wellness visit. Ask your health care provider how often you should have your eyes and teeth checked. Stay up to date on all vaccines. This information is not intended to replace advice given to you by your health care provider. Make sure you discuss any questions you have with your health care provider. Document Revised: 06/23/2020 Document Reviewed: 06/23/2020 Elsevier Patient Education  2022 [employer].

## 2020-12-09 ENCOUNTER — Other Ambulatory Visit: Payer: BC Managed Care – PPO

## 2020-12-09 DIAGNOSIS — E7800 Pure hypercholesterolemia, unspecified: Secondary | ICD-10-CM | POA: Diagnosis not present

## 2020-12-09 DIAGNOSIS — Z125 Encounter for screening for malignant neoplasm of prostate: Secondary | ICD-10-CM

## 2020-12-10 LAB — CMP14+EGFR
ALT: 29 IU/L (ref 0–44)
AST: 30 IU/L (ref 0–40)
Albumin/Globulin Ratio: 2 (ref 1.2–2.2)
Albumin: 4.7 g/dL (ref 4.0–5.0)
Alkaline Phosphatase: 91 IU/L (ref 44–121)
BUN/Creatinine Ratio: 12 (ref 9–20)
BUN: 12 mg/dL (ref 6–24)
Bilirubin Total: 0.5 mg/dL (ref 0.0–1.2)
CO2: 22 mmol/L (ref 20–29)
Calcium: 9 mg/dL (ref 8.7–10.2)
Chloride: 104 mmol/L (ref 96–106)
Creatinine, Ser: 0.98 mg/dL (ref 0.76–1.27)
Globulin, Total: 2.4 g/dL (ref 1.5–4.5)
Glucose: 69 mg/dL — ABNORMAL LOW (ref 70–99)
Potassium: 4.2 mmol/L (ref 3.5–5.2)
Sodium: 143 mmol/L (ref 134–144)
Total Protein: 7.1 g/dL (ref 6.0–8.5)
eGFR: 100 mL/min/{1.73_m2} (ref >59)

## 2020-12-10 LAB — LIPID PANEL
Chol/HDL Ratio: 5.6 ratio — ABNORMAL HIGH (ref 0.0–5.0)
Cholesterol, Total: 178 mg/dL (ref 100–199)
HDL: 32 mg/dL — ABNORMAL LOW (ref >39)
LDL Chol Calc (NIH): 124 mg/dL — ABNORMAL HIGH (ref 0–99)
Triglycerides: 121 mg/dL (ref 0–149)
VLDL Cholesterol Cal: 22 mg/dL (ref 5–40)

## 2020-12-10 LAB — TSH: TSH: 3.5 u[IU]/mL (ref 0.450–4.500)

## 2020-12-10 LAB — PSA: Prostate Specific Ag, Serum: 1.1 ng/mL (ref 0.0–4.0)

## 2020-12-15 DIAGNOSIS — M62830 Muscle spasm of back: Secondary | ICD-10-CM | POA: Diagnosis not present

## 2020-12-15 DIAGNOSIS — M9904 Segmental and somatic dysfunction of sacral region: Secondary | ICD-10-CM | POA: Diagnosis not present

## 2020-12-15 DIAGNOSIS — M9905 Segmental and somatic dysfunction of pelvic region: Secondary | ICD-10-CM | POA: Diagnosis not present

## 2020-12-15 DIAGNOSIS — M9903 Segmental and somatic dysfunction of lumbar region: Secondary | ICD-10-CM | POA: Diagnosis not present

## 2020-12-16 ENCOUNTER — Other Ambulatory Visit (HOSPITAL_COMMUNITY): Payer: Self-pay

## 2021-02-15 ENCOUNTER — Other Ambulatory Visit: Payer: Self-pay | Admitting: Family Medicine

## 2021-02-15 DIAGNOSIS — B9689 Other specified bacterial agents as the cause of diseases classified elsewhere: Secondary | ICD-10-CM

## 2021-02-15 MED ORDER — AMOXICILLIN-POT CLAVULANATE 875-125 MG PO TABS: 1.0000 | ORAL_TABLET | Freq: Two times a day (BID) | ORAL | 0 refills | Status: DC

## 2021-03-26 ENCOUNTER — Other Ambulatory Visit (HOSPITAL_COMMUNITY): Payer: Self-pay

## 2021-03-29 ENCOUNTER — Other Ambulatory Visit (HOSPITAL_COMMUNITY): Payer: Self-pay

## 2021-04-07 ENCOUNTER — Ambulatory Visit (INDEPENDENT_AMBULATORY_CARE_PROVIDER_SITE_OTHER): Payer: BC Managed Care – PPO

## 2021-04-07 ENCOUNTER — Ambulatory Visit: Payer: BC Managed Care – PPO | Admitting: Family Medicine

## 2021-04-07 ENCOUNTER — Encounter: Payer: Self-pay | Admitting: Family Medicine

## 2021-04-07 VITALS — BP 129/86 | HR 75 | Temp 98.0°F | Ht 69.0 in | Wt 209.4 lb

## 2021-04-07 DIAGNOSIS — S6991XA Unspecified injury of right wrist, hand and finger(s), initial encounter: Secondary | ICD-10-CM | POA: Diagnosis not present

## 2021-04-07 DIAGNOSIS — S62666A Nondisplaced fracture of distal phalanx of right little finger, initial encounter for closed fracture: Secondary | ICD-10-CM | POA: Diagnosis not present

## 2021-04-07 DIAGNOSIS — Z23 Encounter for immunization: Secondary | ICD-10-CM | POA: Diagnosis not present

## 2021-04-07 DIAGNOSIS — S62634A Displaced fracture of distal phalanx of right ring finger, initial encounter for closed fracture: Secondary | ICD-10-CM | POA: Diagnosis not present

## 2021-04-07 DIAGNOSIS — S6010XA Contusion of unspecified finger with damage to nail, initial encounter: Secondary | ICD-10-CM

## 2021-04-07 MED ORDER — AMOXICILLIN-POT CLAVULANATE 875-125 MG PO TABS: 1.0000 | ORAL_TABLET | Freq: Two times a day (BID) | ORAL | 0 refills | Status: DC

## 2021-04-07 MED ORDER — KETOROLAC TROMETHAMINE 30 MG/ML IJ SOLN
30.0000 mg | Freq: Once | INTRAMUSCULAR | Status: AC
  Administered 2021-04-07: 30 mg via INTRAMUSCULAR

## 2021-04-07 NOTE — Progress Notes (Signed)
? ?Subjective: ?CC: Finger injury ?PCP: Raliegh Ip, DO ?Bryan Wilson is a 41 y.o. male presenting to clinic today for: ? ?1.  Finger injury ?Patient injured his right pinky finger at work yesterday morning.  He notes that immediately turned black and blue and swollen.  He tried to relieve some of the pressure with a sterile needle on the palmar aspect of the finger but this only provided minimal relief.  He reports a lot of pain and pressure right underneath the nailbed where there appears to be a hematoma.  He has limited active range of motion secondary to swelling.  Utilizing OTC analgesics with minimal relief ? ? ?ROS: Per HPI ? ?Allergies  ?Allergen Reactions  ? Tramadol Nausea And Vomiting  ? Shellfish Allergy Itching and Rash  ? ?Past Medical History:  ?Diagnosis Date  ? Allergy   ? Anxiety   ? Depression   ? Nephrolithiasis 06/2013  ? ? ?Current Outpatient Medications:  ?  amoxicillin-clavulanate (AUGMENTIN) 875-125 MG tablet, Take 1 tablet by mouth 2 (two) times daily., Disp: 20 tablet, Rfl: 0 ?  fexofenadine (ALLEGRA) 180 MG tablet, Take 1 tablet (180 mg total) by mouth daily. If ins does not cover, he will get OTC if cheaper., Disp: 90 tablet, Rfl: 3 ?  mometasone (NASONEX) 50 MCG/ACT nasal spray, Use 2 sprays into each nostril daily., Disp: 17 g, Rfl: 12 ?  omeprazole (PRILOSEC) 40 MG capsule, Take 1 capsule (40 mg total) by mouth daily., Disp: 90 capsule, Rfl: 3 ?Social History  ? ?Socioeconomic History  ? Marital status: Married  ?  Spouse name: Not on file  ? Number of children: 2  ? Years of education: Not on file  ? Highest education level: Not on file  ?Occupational History  ? Not on file  ?Tobacco Use  ? Smoking status: Never  ? Smokeless tobacco: Never  ?Substance and Sexual Activity  ? Alcohol use: Yes  ?  Alcohol/week: 12.0 standard drinks  ?  Types: 12 Cans of beer per week  ? Drug use: No  ? Sexual activity: Yes  ?  Birth control/protection: Surgical  ?Other Topics Concern  ?  Not on file  ?Social History Narrative  ? Works at [employer], married to a Tour manager  ? Enjoys hunting  ? ?Social Determinants of Health  ? ?Financial Resource Strain: Not on file  ?Food Insecurity: Not on file  ?Transportation Needs: Not on file  ?Physical Activity: Not on file  ?Stress: Not on file  ?Social Connections: Not on file  ?Intimate Partner Violence: Not on file  ? ?Family History  ?Problem Relation Age of Onset  ? Kidney Stones Father   ? Hyperlipidemia Father   ? Heart attack Maternal Grandfather 55  ? Alcohol abuse Neg Hx   ? Asthma Neg Hx   ? Heart disease Neg Hx   ? Hypertension Neg Hx   ? Cancer Neg Hx   ? ? ?Objective: ?Office vital signs reviewed. ?BP 129/86   Pulse 75   Temp 98 ?F (36.7 ?C)   Ht 5\' 9"  (1.753 m)   Wt 209 lb 6.4 oz (95 kg)   SpO2 97%   BMI 30.92 kg/m?  ? ?Physical Examination:  ?General: Awake, alert, well nourished, No acute distress ?MSK: Marked soft tissue swelling.  There is a very large subungual hematoma present on the right pinky.  There is ecchymosis noted throughout the distal end of the right pinky including the palmar aspect ? ?  DG Finger Little Right ? ?Result Date: 04/07/2021 ?CLINICAL DATA:  Crush injury EXAM: RIGHT LITTLE FINGER 2+V COMPARISON:  None. FINDINGS: Tiny acute fracture fragment identified at the tuft of the fifth distal phalanx. Joint spaces are preserved. IMPRESSION: As above. Electronically Signed   By: Jannifer Hick M.D.   On: 04/07/2021 14:23   ? ?Trephination: ?Right fifth digit cleaned with Betadine x3.  Cautery was used to puncture a small hole.  He had good release of blood from that hole.  No purulent material.  Lesion was hemostatic and then subsequently dressed with a splint ? ?Assessment/ Plan: ?41 y.o. male  ? ?Injury of finger of right hand, initial encounter - Plan: DG Finger Little Right, Td : Tetanus/diphtheria >7yo Preservative  free ? ?Closed nondisplaced fracture of distal phalanx of right little finger, initial  encounter - Plan: Td : Tetanus/diphtheria >7yo Preservative  free, amoxicillin-clavulanate (AUGMENTIN) 875-125 MG tablet ? ?Subungual hematoma of digit of hand, initial encounter - Plan: Td : Tetanus/diphtheria >7yo Preservative  free, amoxicillin-clavulanate (AUGMENTIN) 875-125 MG tablet ? ?Given degree of swelling and ecchymosis I did obtain an x-ray and this did show what appears to be a tiny fracture on the palmar aspect of the distal end of the right fifth digit.  This was confirmed by radiology.  Given the degree of pressure and pain I did proceed with trephination of this nail which did provide immediate relief. ? ?Tetanus shot was updated.  Encourage p.o. oral analgesics.  Augmentin to be sent for empiric coverage.  Encourage sections reviewed and reasons for reevaluation discussed.  He was splinted in a sugar-tong splint and buddy taped to the adjacent finger.  Would recommend use of this for at least the next 10 to 14 days ? ?No orders of the defined types were placed in this encounter. ? ?No orders of the defined types were placed in this encounter. ? ? ? ?Raliegh Ip, DO ?[facility] ?[phone] ? ?

## 2021-04-07 NOTE — Addendum Note (Signed)
Addended by: Everlean Cherry on: 04/07/2021 05:22 PM ? ? Modules accepted: Orders ? ?

## 2021-08-11 DIAGNOSIS — L821 Other seborrheic keratosis: Secondary | ICD-10-CM | POA: Diagnosis not present

## 2021-08-11 DIAGNOSIS — D225 Melanocytic nevi of trunk: Secondary | ICD-10-CM | POA: Diagnosis not present

## 2021-08-11 DIAGNOSIS — L570 Actinic keratosis: Secondary | ICD-10-CM | POA: Diagnosis not present

## 2021-08-11 DIAGNOSIS — L820 Inflamed seborrheic keratosis: Secondary | ICD-10-CM | POA: Diagnosis not present

## 2021-08-11 DIAGNOSIS — D224 Melanocytic nevi of scalp and neck: Secondary | ICD-10-CM | POA: Diagnosis not present

## 2021-08-27 ENCOUNTER — Other Ambulatory Visit: Payer: Self-pay | Admitting: Family Medicine

## 2021-08-27 DIAGNOSIS — K200 Eosinophilic esophagitis: Secondary | ICD-10-CM

## 2021-08-28 MED ORDER — OMEPRAZOLE 40 MG PO CPDR
40.0000 mg | DELAYED_RELEASE_CAPSULE | Freq: Every day | ORAL | 0 refills | Status: DC
  Filled 2021-08-28: qty 90, 90d supply, fill #0

## 2021-08-29 ENCOUNTER — Other Ambulatory Visit (HOSPITAL_COMMUNITY): Payer: Self-pay

## 2021-08-31 ENCOUNTER — Other Ambulatory Visit (HOSPITAL_COMMUNITY): Payer: Self-pay

## 2021-11-09 ENCOUNTER — Encounter (INDEPENDENT_AMBULATORY_CARE_PROVIDER_SITE_OTHER): Payer: BC Managed Care – PPO | Admitting: Family Medicine

## 2021-11-09 DIAGNOSIS — B9689 Other specified bacterial agents as the cause of diseases classified elsewhere: Secondary | ICD-10-CM | POA: Diagnosis not present

## 2021-11-09 DIAGNOSIS — J019 Acute sinusitis, unspecified: Secondary | ICD-10-CM | POA: Diagnosis not present

## 2021-11-09 MED ORDER — AZITHROMYCIN 250 MG PO TABS: ORAL_TABLET | ORAL | 0 refills | Status: DC

## 2021-11-09 NOTE — Telephone Encounter (Signed)

## 2022-01-27 ENCOUNTER — Other Ambulatory Visit: Payer: Self-pay | Admitting: Family Medicine

## 2022-01-27 DIAGNOSIS — K200 Eosinophilic esophagitis: Secondary | ICD-10-CM

## 2022-01-28 ENCOUNTER — Other Ambulatory Visit (HOSPITAL_COMMUNITY): Payer: Self-pay

## 2022-01-28 MED ORDER — OMEPRAZOLE 40 MG PO CPDR
40.0000 mg | DELAYED_RELEASE_CAPSULE | Freq: Every day | ORAL | 0 refills | Status: DC
  Filled 2022-01-28: qty 90, 90d supply, fill #0

## 2022-01-30 ENCOUNTER — Other Ambulatory Visit (HOSPITAL_COMMUNITY): Payer: Self-pay

## 2022-02-28 ENCOUNTER — Ambulatory Visit: Payer: BC Managed Care – PPO | Admitting: Family Medicine

## 2022-02-28 ENCOUNTER — Other Ambulatory Visit: Payer: BC Managed Care – PPO

## 2022-02-28 ENCOUNTER — Encounter: Payer: Self-pay | Admitting: Family Medicine

## 2022-02-28 VITALS — BP 127/72 | HR 73 | Temp 98.1°F | Ht 69.0 in | Wt 208.0 lb

## 2022-02-28 DIAGNOSIS — Z8249 Family history of ischemic heart disease and other diseases of the circulatory system: Secondary | ICD-10-CM

## 2022-02-28 DIAGNOSIS — E7800 Pure hypercholesterolemia, unspecified: Secondary | ICD-10-CM

## 2022-02-28 DIAGNOSIS — R6882 Decreased libido: Secondary | ICD-10-CM

## 2022-02-28 DIAGNOSIS — Z0000 Encounter for general adult medical examination without abnormal findings: Secondary | ICD-10-CM

## 2022-02-28 DIAGNOSIS — F418 Other specified anxiety disorders: Secondary | ICD-10-CM | POA: Diagnosis not present

## 2022-02-28 DIAGNOSIS — Z0001 Encounter for general adult medical examination with abnormal findings: Secondary | ICD-10-CM

## 2022-02-28 DIAGNOSIS — K200 Eosinophilic esophagitis: Secondary | ICD-10-CM | POA: Diagnosis not present

## 2022-02-28 DIAGNOSIS — Z125 Encounter for screening for malignant neoplasm of prostate: Secondary | ICD-10-CM

## 2022-02-28 DIAGNOSIS — R5383 Other fatigue: Secondary | ICD-10-CM

## 2022-02-28 MED ORDER — OMEPRAZOLE 40 MG PO CPDR: 40.0000 mg | DELAYED_RELEASE_CAPSULE | Freq: Every day | ORAL | 3 refills | Status: DC

## 2022-02-28 MED ORDER — HYDROXYZINE HCL 25 MG PO TABS: 12.5000 mg | ORAL_TABLET | Freq: Three times a day (TID) | ORAL | 0 refills | Status: DC | PRN

## 2022-02-28 NOTE — Patient Instructions (Signed)
Fatigue If you have fatigue, you feel tired all the time and have a lack of energy or a lack of motivation. Fatigue may make it difficult to start or complete tasks because of exhaustion. Occasional or mild fatigue is often a normal response to activity or life. However, long-term (chronic) or extreme fatigue may be a symptom of a medical condition such as: Depression. Not having enough red blood cells or hemoglobin in the blood (anemia). A problem with a small gland located in the lower front part of the neck (thyroid disorder). Rheumatologic conditions. These are problems related to the body's defense system (immune system). Infections, especially certain viral infections. Fatigue can also lead to negative health outcomes over time. Follow these instructions at home: Medicines Take over-the-counter and prescription medicines only as told by your health care provider. Take a multivitamin if told by your health care provider. Do not use herbal or dietary supplements unless they are approved by your health care provider. Eating and drinking  Avoid heavy meals in the evening. Eat a well-balanced diet, which includes lean proteins, whole grains, plenty of fruits and vegetables, and low-fat dairy products. Avoid eating or drinking too many products with caffeine in them. Avoid alcohol. Drink enough fluid to keep your urine pale yellow. Activity  Exercise regularly, as told by your health care provider. Use or practice techniques to help you relax, such as yoga, tai chi, meditation, or massage therapy. Lifestyle Change situations that cause you stress. Try to keep your work and personal schedules in balance. Do not use recreational or illegal drugs. General instructions Monitor your fatigue for any changes. Go to bed and get up at the same time every day. Avoid fatigue by pacing yourself during the day and getting enough sleep at night. Maintain a healthy weight. Contact a health care  provider if: Your fatigue does not get better. You have a fever. You suddenly lose or gain weight. You have headaches. You have trouble falling asleep or sleeping through the night. You feel angry, guilty, anxious, or sad. You have swelling in your legs or another part of your body. Get help right away if: You feel confused, feel like you might faint, or faint. Your vision is blurry or you have a severe headache. You have severe pain in your abdomen, your back, or the area between your waist and hips (pelvis). You have chest pain, shortness of breath, or an irregular or fast heartbeat. You are unable to urinate, or you urinate less than normal. You have abnormal bleeding from the rectum, nose, lungs, nipples, or, if you are male, the vagina. You vomit blood. You have thoughts about hurting yourself or others. These symptoms may be an emergency. Get help right away. Call 911. Do not wait to see if the symptoms will go away. Do not drive yourself to the hospital. Get help right away if you feel like you may hurt yourself or others, or have thoughts about taking your own life. Go to your nearest emergency room or: Call 911. Call the National Suicide Prevention Lifeline at 1-800-273-8255 or 988. This is open 24 hours a day. Text the Crisis Text Line at 741741. Summary If you have fatigue, you feel tired all the time and have a lack of energy or a lack of motivation. Fatigue may make it difficult to start or complete tasks because of exhaustion. Long-term (chronic) or extreme fatigue may be a symptom of a medical condition. Exercise regularly, as told by your health care provider.   Change situations that cause you stress. Try to keep your work and personal schedules in balance. This information is not intended to replace advice given to you by your health care provider. Make sure you discuss any questions you have with your health care provider. Document Revised: 10/18/2020 Document  Reviewed: 10/18/2020 Elsevier Patient Education  2023 [employer]  

## 2022-02-28 NOTE — Progress Notes (Signed)
Bryan Wilson is a 42 y.o. male presents to office today for annual physical exam examination.    Concerns today include: 1. none  Occupation: works at [employer], Marital status: married, Substance use: none Diet: typical [place], Exercise: no structured Last dental exam: UTD Refills needed today: prilosec Immunizations needed: Immunization History  Administered Date(s) Administered   Influenza,inj,Quad PF,6+ Mos 10/18/2012   Influenza-Unspecified 11/17/2013, 11/13/2014, 11/22/2015, 11/13/2016   Moderna Sars-Covid-2 Vaccination 10/15/2019, 11/12/2019   Td 04/07/2021   Tdap 10/22/2013     Past Medical History:  Diagnosis Date   Allergy    Anxiety    Depression    Nephrolithiasis 06/2013   Social History   Socioeconomic History   Marital status: Married    Spouse name: Bryan Wilson   Number of children: 2   Years of education: Not on file   Highest education level: Not on file  Occupational History   Not on file  Tobacco Use   Smoking status: Never   Smokeless tobacco: Never  Substance and Sexual Activity   Alcohol use: Yes    Alcohol/week: 12.0 standard drinks of alcohol    Types: 12 Cans of beer per week   Drug use: No   Sexual activity: Yes    Birth control/protection: Surgical  Other Topics Concern   Not on file  Social History Narrative   Works at [employer], married to a Teacher, English as a foreign language   Enjoys hunting   Social Determinants of Radio broadcast assistant Strain: Not on file  Food Insecurity: Not on file  Transportation Needs: Not on file  Physical Activity: Not on file  Stress: Not on file  Social Connections: Not on file  Intimate Partner Violence: Not on file   Past Surgical History:  Procedure Laterality Date   VASECTOMY     Family History  Problem Relation Age of Onset   Kidney Stones Father    Hyperlipidemia Father    Heart disease Father    Heart attack Maternal Grandfather 28   Alcohol abuse Neg Hx    Asthma Neg Hx     Hypertension Neg Hx    Cancer Neg Hx     Current Outpatient Medications:    fexofenadine (ALLEGRA) 180 MG tablet, Take 1 tablet (180 mg total) by mouth daily. If ins does not cover, he will get OTC if cheaper., Disp: 90 tablet, Rfl: 3   hydrOXYzine (ATARAX) 25 MG tablet, Take 0.5-2 tablets (12.5-50 mg total) by mouth every 8 (eight) hours as needed for anxiety., Disp: 90 tablet, Rfl: 0   omeprazole (PRILOSEC) 40 MG capsule, Take 1 capsule (40 mg total) by mouth daily., Disp: 90 capsule, Rfl: 3  Allergies  Allergen Reactions   Tramadol Nausea And Vomiting   Shellfish Allergy Itching and Rash     ROS: Review of Systems A comprehensive review of systems was negative except for: Behavioral/Psych: positive for stress at work. Also having some low energy    Physical exam BP 127/72   Pulse 73   Temp 98.1 F (36.7 C) (Temporal)   Ht 5' 9"$  (1.753 m)   Wt 208 lb (94.3 kg)   SpO2 95%   BMI 30.72 kg/m  General appearance: alert, cooperative, appears stated age, and no distress Head: Normocephalic, without obvious abnormality, atraumatic Eyes: negative findings: lids and lashes normal, conjunctivae and sclerae normal, corneas clear, and pupils equal, round, reactive to light and accomodation Ears: normal TM's and external ear canals both ears Nose: Nares normal.  Septum midline. Mucosa normal. No drainage or sinus tenderness. Throat: lips, mucosa, and tongue normal; teeth and gums normal Neck: no adenopathy, no carotid bruit, supple, symmetrical, trachea midline, and thyroid not enlarged, symmetric, no tenderness/mass/nodules Back: symmetric, no curvature. ROM normal. No CVA tenderness. Lungs: clear to auscultation bilaterally Chest wall: no tenderness Heart: regular rate and rhythm, S1, S2 normal, no murmur, click, rub or gallop Abdomen: soft, non-tender; bowel sounds normal; no masses,  no organomegaly Extremities: extremities normal, atraumatic, no cyanosis or edema Pulses: 2+ and  symmetric Skin:  Flesh-colored soft tissue papule noted along the right anterior chest just under the collarbone Lymph nodes: Cervical, supraclavicular, and axillary nodes normal. Neurologic: Grossly normal Psych: Mood stable, speech normal, affect appropriate     02/28/2022    2:17 PM 12/08/2020    2:35 PM 10/15/2019   11:30 AM  Depression screen PHQ 2/9  Decreased Interest 0 0 0  Down, Depressed, Hopeless 0 0 0  PHQ - 2 Score 0 0 0  Altered sleeping 0 0 0  Tired, decreased energy 1 0 0  Change in appetite 0 0 0  Feeling bad or failure about yourself  0 0 0  Trouble concentrating 0 0 0  Moving slowly or fidgety/restless 0 0 0  Suicidal thoughts 0 0 0  PHQ-9 Score 1 0 0  Difficult doing work/chores Not difficult at all Not difficult at all       12/08/2020    2:35 PM 03/09/2015    9:19 AM  GAD 7 : Generalized Anxiety Score  Nervous, Anxious, on Edge 0 0  Control/stop worrying 0 0  Worry too much - different things 0 0  Trouble relaxing 0 0  Restless 0 0  Easily annoyed or irritable 0 1  Afraid - awful might happen 0 0  Total GAD 7 Score 0 1  Anxiety Difficulty Not difficult at all Not difficult at all   Assessment/ Plan: Bryan Wilson here for annual physical exam.  Annual physical exam  Situational anxiety - Plan: hydrOXYzine (ATARAX) 25 MG tablet  Family history of chronic ischemic heart disease  Eosinophilic esophagitis - Plan: omeprazole (PRILOSEC) 40 MG capsule  Pure hypercholesterolemia - Plan: Lipid panel, TSH, CMP14+EGFR  Screening for malignant neoplasm of prostate - Plan: PSA  Low libido - Plan: Testosterone  Low energy - Plan: Vitamin B12, CBC  Atarax given for situational anxiety.  Caution sedation.  Discussed hypercholesterolemia in the setting of known family history of ischemic heart disease in his father.  We discussed consideration for coronary artery calcium score to determine need for statin therapy and/or even referral to cardiology for  surveillance.  Discussed the importance of high-fiber diet, adequate exercise to reduce cardiovascular risk.  He is working on making an exercise regimen with his wife.  Asymptomatic from a prostate standpoint.  Check prostate level.  Given low energy, check vitamin B12, CBC and testosterone level  Counseled on healthy lifestyle choices, including diet (rich in fruits, vegetables and lean meats and low in salt and simple carbohydrates) and exercise (at least 30 minutes of moderate physical activity daily).  Patient to follow up in 1 year for annual exam or sooner if needed.  Bryan Wilson M. Lajuana Ripple, DO

## 2022-03-10 ENCOUNTER — Other Ambulatory Visit: Payer: BC Managed Care – PPO

## 2022-03-10 DIAGNOSIS — R6882 Decreased libido: Secondary | ICD-10-CM | POA: Diagnosis not present

## 2022-03-10 DIAGNOSIS — R6889 Other general symptoms and signs: Secondary | ICD-10-CM | POA: Diagnosis not present

## 2022-03-10 DIAGNOSIS — Z125 Encounter for screening for malignant neoplasm of prostate: Secondary | ICD-10-CM | POA: Diagnosis not present

## 2022-03-10 DIAGNOSIS — R5383 Other fatigue: Secondary | ICD-10-CM | POA: Diagnosis not present

## 2022-03-10 DIAGNOSIS — E7800 Pure hypercholesterolemia, unspecified: Secondary | ICD-10-CM | POA: Diagnosis not present

## 2022-03-11 LAB — CMP14+EGFR
ALT: 22 IU/L (ref 0–44)
AST: 24 IU/L (ref 0–40)
Albumin/Globulin Ratio: 2 (ref 1.2–2.2)
Albumin: 4.6 g/dL (ref 4.1–5.1)
Alkaline Phosphatase: 70 IU/L (ref 44–121)
BUN/Creatinine Ratio: 14 (ref 9–20)
BUN: 15 mg/dL (ref 6–24)
Bilirubin Total: 0.9 mg/dL (ref 0.0–1.2)
CO2: 23 mmol/L (ref 20–29)
Calcium: 9.1 mg/dL (ref 8.7–10.2)
Chloride: 103 mmol/L (ref 96–106)
Creatinine, Ser: 1.11 mg/dL (ref 0.76–1.27)
Globulin, Total: 2.3 g/dL (ref 1.5–4.5)
Glucose: 90 mg/dL (ref 70–99)
Potassium: 4.7 mmol/L (ref 3.5–5.2)
Sodium: 141 mmol/L (ref 134–144)
Total Protein: 6.9 g/dL (ref 6.0–8.5)
eGFR: 86 mL/min/{1.73_m2} (ref >59)

## 2022-03-11 LAB — VITAMIN B12: Vitamin B-12: 389 pg/mL (ref 232–1245)

## 2022-03-11 LAB — CBC
Hematocrit: 41.7 % (ref 37.5–51.0)
Hemoglobin: 14.3 g/dL (ref 13.0–17.7)
MCH: 29.9 pg (ref 26.6–33.0)
MCHC: 34.3 g/dL (ref 31.5–35.7)
MCV: 87 fL (ref 79–97)
Platelets: 202 10*3/uL (ref 150–450)
RBC: 4.79 x10E6/uL (ref 4.14–5.80)
RDW: 12.7 % (ref 11.6–15.4)
WBC: 4 10*3/uL (ref 3.4–10.8)

## 2022-03-11 LAB — LIPID PANEL
Chol/HDL Ratio: 5.4 ratio — ABNORMAL HIGH (ref 0.0–5.0)
Cholesterol, Total: 207 mg/dL — ABNORMAL HIGH (ref 100–199)
HDL: 38 mg/dL — ABNORMAL LOW (ref >39)
LDL Chol Calc (NIH): 146 mg/dL — ABNORMAL HIGH (ref 0–99)
Triglycerides: 125 mg/dL (ref 0–149)
VLDL Cholesterol Cal: 23 mg/dL (ref 5–40)

## 2022-03-11 LAB — TSH: TSH: 3.27 u[IU]/mL (ref 0.450–4.500)

## 2022-03-11 LAB — PSA: Prostate Specific Ag, Serum: 1 ng/mL (ref 0.0–4.0)

## 2022-03-11 LAB — TESTOSTERONE: Testosterone: 398 ng/dL (ref 264–916)

## 2022-03-13 ENCOUNTER — Encounter (INDEPENDENT_AMBULATORY_CARE_PROVIDER_SITE_OTHER): Payer: BC Managed Care – PPO | Admitting: Family Medicine

## 2022-03-13 DIAGNOSIS — J0101 Acute recurrent maxillary sinusitis: Secondary | ICD-10-CM

## 2022-03-14 MED ORDER — AMOXICILLIN-POT CLAVULANATE 875-125 MG PO TABS: 1.0000 | ORAL_TABLET | Freq: Two times a day (BID) | ORAL | 0 refills | Status: DC

## 2022-03-14 NOTE — Telephone Encounter (Signed)

## 2022-03-27 ENCOUNTER — Other Ambulatory Visit: Payer: Self-pay | Admitting: Family Medicine

## 2022-03-27 ENCOUNTER — Encounter: Payer: Self-pay | Admitting: Family Medicine

## 2022-03-27 ENCOUNTER — Ambulatory Visit (INDEPENDENT_AMBULATORY_CARE_PROVIDER_SITE_OTHER): Payer: BC Managed Care – PPO

## 2022-03-27 ENCOUNTER — Ambulatory Visit: Payer: BC Managed Care – PPO | Admitting: Family Medicine

## 2022-03-27 VITALS — BP 105/69 | HR 78 | Temp 98.7°F | Ht 69.0 in | Wt 208.0 lb

## 2022-03-27 DIAGNOSIS — M542 Cervicalgia: Secondary | ICD-10-CM

## 2022-03-27 DIAGNOSIS — M5136 Other intervertebral disc degeneration, lumbar region: Secondary | ICD-10-CM | POA: Diagnosis not present

## 2022-03-27 DIAGNOSIS — E7800 Pure hypercholesterolemia, unspecified: Secondary | ICD-10-CM

## 2022-03-27 DIAGNOSIS — R0789 Other chest pain: Secondary | ICD-10-CM

## 2022-03-27 DIAGNOSIS — R0781 Pleurodynia: Secondary | ICD-10-CM

## 2022-03-27 DIAGNOSIS — M546 Pain in thoracic spine: Secondary | ICD-10-CM | POA: Diagnosis not present

## 2022-03-27 DIAGNOSIS — Z8249 Family history of ischemic heart disease and other diseases of the circulatory system: Secondary | ICD-10-CM

## 2022-03-27 MED ORDER — KETOROLAC TROMETHAMINE 60 MG/2ML IM SOLN: 60.0000 mg | Freq: Once | INTRAMUSCULAR | Status: AC

## 2022-03-27 MED ORDER — ROSUVASTATIN CALCIUM 10 MG PO TABS: 10.0000 mg | ORAL_TABLET | Freq: Every day | ORAL | 3 refills | Status: DC

## 2022-03-27 NOTE — Patient Instructions (Signed)
Cervical Sprain A cervical sprain is also called a neck sprain. It is a stretch or tear in one or more ligaments in the neck. Ligaments are tissues that connect bones to each other. Neck sprains can be mild, bad, or very bad. A very bad sprain can cause problems with bones and other structures.  Most neck sprains heal in 4-6 weeks, but this depends on how bad the injury is. What are the causes? Neck sprains may be caused by trauma, such as: An injury from a motor vehicle accident. A fall. The head and neck being moved front to back or side to side all of a sudden (whiplash injury). Mild neck sprains may be caused by wear and tear over time. What increases the risk? Some activities put you at risk of hurting your neck. These include: Contact sports. Gymnastics. Diving. Arthritis caused by wear and tear of the joints in the spine. The neck not being very strong or flexible. Having had a neck injury in the past. Poor posture. Spending a lot of time in positions that put stress on the neck. This may be from sitting at a computer for a long time. What are the signs or symptoms? Signs or symptoms include any of these problems in your neck, shoulders, or upper back: Pain or tenderness. Stiffness. Swelling. A burning feeling. Sudden tightening of neck muscles (spasms). Not being able to move the neck very much. Headache. Feeling dizzy. Feeling like you may vomit, or vomiting. Having a hand or arm that: Feels weak. Loses feeling (feels numb). Tingles. How is this treated? This condition is treated by: Resting and putting ice or heat on your neck. Doing exercises to improve movement and strength in the injured area (physical therapy). In some cases, treatment may also include: Keeping your neck in place for a length of time. This may be done using: A neck collar. This supports your chin and the back of your head. A cervical traction device. This is a sling that holds up your head. It  removes weight and pressure from your neck. Medicines for pain or other symptoms. Surgery. This is rare. Follow these instructions at home: Medicines Take over-the-counter and prescription medicines only as told by your doctor. Ask your doctor if you should avoid driving or using machines while you are taking your medicine. If told, take steps to prevent problems with pooping (constipation). You may need to: Drink enough fluid to keep your pee (urine) pale yellow. Take medicines. You will be told what medicines to take. Eat foods that are high in fiber. These include beans, whole grains, and fresh fruits and vegetables. Limit foods that are high in fat and sugar. These include fried or sweet foods. If you have a neck collar: Wear it as told by your doctor. Do not take it off unless told. Ask your doctor before adjusting your collar. If you have long hair, keep it outside of the collar. If you are allowed to take off the collar for cleaning and bathing: Follow instructions about how to take it off safely. Clean it by hand with mild soap and water. Let it air-dry fully. If your collar has pads that you can take out: Take the pads out every 1-2 days. Wash them by hand with soap and water. Let the pads air-dry fully before you put them back in the collar. Tell your doctor if your skin under the collar has irritation or sores. Managing pain, stiffness, and swelling     Use a  cervical traction device, if told by your doctor. If told, put ice on the affected area. Put ice in a plastic bag. Place a towel between your skin and the bag. Leave the ice on for 20 minutes, 2-3 times a day. If told, put heat on the affected area. Do this before exercise or as often as told by your doctor. Use the heat source that your doctor recommends, such as a moist heat pack or a heating pad. Place a towel between your skin and the heat source. Leave the heat on for 20-30 minutes. If your skin turns bright  red, take off the ice or heat right away to prevent skin damage. The risk of damage is higher if you cannot feel pain, heat, or cold. Activity Do not drive while wearing a neck collar. If you do not have a neck collar, ask if it is safe to drive while your neck heals. Do not lift anything that is heavier than 10 lb (4.5 kg). Rest as told by your doctor. Avoid positions and activities that make you feel worse. Do exercises as told by your doctor or physical therapist. Return to your normal activities when your doctor says that it is safe. General instructions Do not smoke or use any products that contain nicotine or tobacco. These can delay healing. If you need help quitting, ask your doctor. Keep all follow-up visits. Your doctor will check your injury and activity level. How is this prevented? Use good posture. Adjust your workstation to help with this. Exercise often as told by your doctor or physical therapist. Avoid activities that are risky or may cause a neck sprain. Contact a doctor if: Your symptoms get worse or do not get better after 2 weeks. Your pain gets worse. Medicine does not help your pain. You have new symptoms. Your neck collar gives you sores on your skin or bothers your skin. Get help right away if: You have very bad pain. You get any of the following in any part of your body: Loss of feeling. Tingling. Weakness. You cannot move a part of your body. You have neck pain and either of these: Very bad dizziness. A very bad headache. This information is not intended to replace advice given to you by your health care provider. Make sure you discuss any questions you have with your health care provider. Document Revised: 07/29/2021 Document Reviewed: 07/29/2021 Elsevier Patient Education  [place].
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
## 2022-04-24 ENCOUNTER — Other Ambulatory Visit (HOSPITAL_COMMUNITY): Payer: Self-pay

## 2022-04-24 ENCOUNTER — Other Ambulatory Visit: Payer: Self-pay | Admitting: Family Medicine

## 2022-04-24 DIAGNOSIS — K200 Eosinophilic esophagitis: Secondary | ICD-10-CM

## 2022-04-24 MED ORDER — OMEPRAZOLE 40 MG PO CPDR
40.0000 mg | DELAYED_RELEASE_CAPSULE | Freq: Every day | ORAL | 3 refills | Status: DC
Start: 2022-04-24 — End: 2023-07-18
  Filled 2022-04-24: qty 90, 90d supply, fill #0
  Filled 2022-07-26: qty 90, 90d supply, fill #1

## 2022-04-25 ENCOUNTER — Other Ambulatory Visit: Payer: Self-pay

## 2022-04-25 ENCOUNTER — Other Ambulatory Visit (HOSPITAL_COMMUNITY): Payer: Self-pay

## 2022-05-08 ENCOUNTER — Ambulatory Visit (INDEPENDENT_AMBULATORY_CARE_PROVIDER_SITE_OTHER): Payer: BC Managed Care – PPO | Admitting: Family Medicine

## 2022-05-08 ENCOUNTER — Encounter: Payer: Self-pay | Admitting: Family Medicine

## 2022-05-08 VITALS — BP 121/77 | HR 84 | Temp 98.7°F | Wt 207.0 lb

## 2022-05-08 DIAGNOSIS — S61302A Unspecified open wound of right middle finger with damage to nail, initial encounter: Secondary | ICD-10-CM

## 2022-05-08 MED ORDER — CEFTRIAXONE SODIUM 1 G IJ SOLR
1.0000 g | Freq: Once | INTRAMUSCULAR | Status: AC
Start: 2022-05-08 — End: 2022-05-08
  Administered 2022-05-08: 1 g via INTRAMUSCULAR

## 2022-05-08 MED ORDER — DOXYCYCLINE HYCLATE 100 MG PO TABS
100.0000 mg | ORAL_TABLET | Freq: Two times a day (BID) | ORAL | 0 refills | Status: AC
Start: 2022-05-08 — End: 2022-05-15

## 2022-05-08 NOTE — Progress Notes (Signed)
Subjective: ZO:XWRUEA injury PCP: Raliegh Ip, DO [id number] Bryan Wilson is a 42 y.o. male presenting to clinic today for:  1.  Finger injury Patient reports that he injured his right middle finger over the weekend when he was attempting to move a log.  Unfortunately his finger became pinched between 2 logs and his middle finger nail was avulsed.  It bled quite profusely.  His wife, who is a nurse cleaned the wound and applied pressure.  It seems to be doing a little bit better today.  He is run no fevers.  He does note some fluid that is weeping from the wound and is not sure if this is indicative of infection.  He is able to move the finger.  He is up-to-date on tetanus shot.  Not anticoagulated.  Has been utilizing Augmentin for the last 2 days as he had some of this leftover from a previous sinus infection.   ROS: Per HPI  Allergies  Allergen Reactions   Tramadol Nausea And Vomiting   Shellfish Allergy Itching and Rash   Past Medical History:  Diagnosis Date   Allergy    Anxiety    Depression    Nephrolithiasis 06/2013    Current Outpatient Medications:    fexofenadine (ALLEGRA) 180 MG tablet, Take 1 tablet (180 mg total) by mouth daily. If ins does not cover, he will get OTC if cheaper., Disp: 90 tablet, Rfl: 3   hydrOXYzine (ATARAX) 25 MG tablet, Take 0.5-2 tablets (12.5-50 mg total) by mouth every 8 (eight) hours as needed for anxiety., Disp: 90 tablet, Rfl: 0   omeprazole (PRILOSEC) 40 MG capsule, Take 1 capsule (40 mg total) by mouth daily., Disp: 90 capsule, Rfl: 3   rosuvastatin (CRESTOR) 10 MG tablet, Take 1 tablet (10 mg total) by mouth daily., Disp: 90 tablet, Rfl: 3 Social History   Socioeconomic History   Marital status: Married    Spouse name: Morrie Sheldon   Number of children: 2   Years of education: Not on file   Highest education level: Not on file  Occupational History   Not on file  Tobacco Use   Smoking status: Never   Smokeless tobacco: Never   Substance and Sexual Activity   Alcohol use: Yes    Alcohol/week: 12.0 standard drinks of alcohol    Types: 12 Cans of beer per week   Drug use: No   Sexual activity: Yes    Birth control/protection: Surgical  Other Topics Concern   Not on file  Social History Narrative   Works at [employer], married to a Tour manager   Enjoys hunting   Social Determinants of Corporate investment banker Strain: Not on file  Food Insecurity: Not on file  Transportation Needs: Not on file  Physical Activity: Not on file  Stress: Not on file  Social Connections: Not on file  Intimate Partner Violence: Not on file   Family History  Problem Relation Age of Onset   Kidney Stones Father    Hyperlipidemia Father    Heart disease Father    Heart attack Maternal Grandfather 51   Alcohol abuse Neg Hx    Asthma Neg Hx    Hypertension Neg Hx    Cancer Neg Hx     Objective: Office vital signs reviewed. BP 121/77   Pulse 84   Temp 98.7 F (37.1 C)   Wt 207 lb (93.9 kg)   SpO2 97%   BMI 30.57 kg/m   Physical Examination:  General: Awake, alert, well nourished, No acute distress MSK:  Right middle finger: Soft tissue swelling appreciated.  He has a hemostatic laceration appreciated at the distal end of the middle finger.  The nail is visibly avulsed, particularly along the radial aspect near the quick but it is lying flat against the nailbed and demonstrates no evidence of purulent discharge.  Serosanguineous fluid was noted.  No warmth.  Able to move the digit without difficulty  Assessment/ Plan: 42 y.o. male   Avulsion of nail of right middle finger - Plan: cefTRIAXone (ROCEPHIN) injection 1 g, doxycycline (VIBRA-TABS) 100 MG tablet  Considered removing the avulsed nail but it seems to be hemostatic and healing well and no signs of infection at this time.  The drainage from the nail appears to be serosanguineous in nature but given that this was a dirty wound that may or may not have  been totally irrigated after injury, I am going to empirically treat him with oral antibiotics.  He was started with Rocephin today since he has to go back to work but he will start doxycycline tomorrow.  Caution sun sensitivity and nausea if taken without food.  Monitor for signs and symptoms of infection and or complication.  We cleaned the wound with sterile saline, applied topical antibiotic and nonstick bandage.  This was also splinted in a sugar-tong so that he would not injure the nail while working as he works quite a bit with his hands.  He is up-to-date on tetanus shot.  No orders of the defined types were placed in this encounter.  No orders of the defined types were placed in this encounter.    Raliegh Ip, DO [facility] [phone]

## 2022-05-08 NOTE — Patient Instructions (Signed)
Nail Avulsion Nail avulsion is when a nail tears away from the nail bed due to an accident or injury. Nail avulsion can be painful. Your finger or toe may bleed a lot, and you may have some pain, redness, throbbing, and swelling while it heals. Your nail will grow back within several months. Once it grows back, it might not look the same as the old nail. This may happen even after taking good care of it. Follow these instructions at home: Wound care Follow instructions from your health care provider about how to take care of your wound. Make sure you: Wash your hands with soap and water for at least 20 seconds before and after you change your bandage (dressing). If soap and water are not available, use hand sanitizer. Change your dressing as told by your provider. If you have them, leave stitches (sutures), skin glue, or tape strips in place. These skin closures may need to stay in place for 2 weeks or longer. If tape strip edges start to loosen and curl up, you may trim the loose edges. Do not remove tape strips completely unless your provider tells you to do that. Check your wound every day for signs of infection. Check for: Redness, swelling, or pain. Fluid or blood. Warmth. Pus or a bad smell. If you have bleeding, press gently on the nail bed with a gauze pad for 15 minutes. Keep the wound dry for 48 hours. Cover your wound with a watertight covering when you take a shower. After 48 hours have passed, lightly wash the finger or toe in warm, soapy water 2-3 times a day. This helps to reduce pain and swelling. It also prevents infection. Do not take baths, swim, or use a hot tub until your provider approves. Medicine Take over-the-counter and prescription medicines only as told by your provider. Talk with your provider before taking aspirin or NSAIDs. These medicines can raise your risk of bleeding. If you were prescribed antibiotics, take them as told by your provider. Do not stop using the  antibiotic even if you start to feel better. General instructions  Raise (elevate) the injured area above the level of your heart while you are sitting or lying down. This helps to reduce pain and swelling. Do this as much possible for the first 48 hours after the injury. Move the toe or finger often to avoid stiffness. Do not use any products that contain nicotine or tobacco. These products include cigarettes, chewing tobacco, and vaping devices, such as e-cigarettes. If you need help quitting, ask your provider. Contact a health care provider if: You have signs of infection around your wound. You have bleeding that does not stop, even when you apply pressure to the wound. You have a temperature that is higher than 100.26F (38C). The affected finger or toe looks white or black. This information is not intended to replace advice given to you by your health care provider. Make sure you discuss any questions you have with your health care provider. Document Revised: 09/14/2021 Document Reviewed: 09/14/2021 Elsevier Patient Education  2023 [employer].

## 2022-07-18 ENCOUNTER — Other Ambulatory Visit: Payer: Self-pay | Admitting: Family Medicine

## 2022-07-18 ENCOUNTER — Other Ambulatory Visit (HOSPITAL_COMMUNITY): Payer: Self-pay

## 2022-07-18 DIAGNOSIS — Z8249 Family history of ischemic heart disease and other diseases of the circulatory system: Secondary | ICD-10-CM

## 2022-07-18 DIAGNOSIS — E7800 Pure hypercholesterolemia, unspecified: Secondary | ICD-10-CM

## 2022-07-18 MED ORDER — ROSUVASTATIN CALCIUM 10 MG PO TABS
10.0000 mg | ORAL_TABLET | Freq: Every day | ORAL | 3 refills | Status: DC
Start: 2022-07-18 — End: 2023-08-10
  Filled 2022-07-18 (×2): qty 90, 90d supply, fill #0
  Filled 2022-10-21: qty 90, 90d supply, fill #1
  Filled 2023-01-28 – 2023-02-02 (×2): qty 90, 90d supply, fill #2
  Filled 2023-05-04: qty 90, 90d supply, fill #3

## 2022-07-19 ENCOUNTER — Other Ambulatory Visit: Payer: Self-pay

## 2022-07-27 ENCOUNTER — Other Ambulatory Visit (HOSPITAL_COMMUNITY): Payer: Self-pay

## 2022-08-29 ENCOUNTER — Other Ambulatory Visit: Payer: BC Managed Care – PPO

## 2022-08-29 ENCOUNTER — Encounter: Payer: Self-pay | Admitting: Family Medicine

## 2022-08-29 DIAGNOSIS — E7800 Pure hypercholesterolemia, unspecified: Secondary | ICD-10-CM

## 2022-09-19 ENCOUNTER — Other Ambulatory Visit: Payer: BC Managed Care – PPO

## 2022-09-19 DIAGNOSIS — E7800 Pure hypercholesterolemia, unspecified: Secondary | ICD-10-CM

## 2022-09-20 LAB — LIPID PANEL
Chol/HDL Ratio: 3.4 ratio (ref 0.0–5.0)
Cholesterol, Total: 148 mg/dL (ref 100–199)
HDL: 44 mg/dL (ref >39)
LDL Chol Calc (NIH): 77 mg/dL (ref 0–99)
Triglycerides: 159 mg/dL — ABNORMAL HIGH (ref 0–149)
VLDL Cholesterol Cal: 27 mg/dL (ref 5–40)

## 2022-09-20 LAB — HEPATIC FUNCTION PANEL
ALT: 29 IU/L (ref 0–44)
AST: 26 IU/L (ref 0–40)
Albumin: 4.5 g/dL (ref 4.1–5.1)
Alkaline Phosphatase: 74 IU/L (ref 44–121)
Bilirubin Total: 0.7 mg/dL (ref 0.0–1.2)
Bilirubin, Direct: 0.15 mg/dL (ref 0.00–0.40)
Total Protein: 7 g/dL (ref 6.0–8.5)

## 2022-10-22 ENCOUNTER — Other Ambulatory Visit (HOSPITAL_COMMUNITY): Payer: Self-pay

## 2023-01-29 ENCOUNTER — Other Ambulatory Visit: Payer: Self-pay

## 2023-02-01 ENCOUNTER — Other Ambulatory Visit: Payer: Self-pay

## 2023-02-02 ENCOUNTER — Other Ambulatory Visit (HOSPITAL_COMMUNITY): Payer: Self-pay

## 2023-02-03 LAB — LAB REPORT - SCANNED
A1c: 4.7
EGFR: 86
PSA, Total: 0.78

## 2023-05-05 ENCOUNTER — Other Ambulatory Visit (HOSPITAL_COMMUNITY): Payer: Self-pay

## 2023-05-08 ENCOUNTER — Other Ambulatory Visit (HOSPITAL_COMMUNITY): Payer: Self-pay

## 2023-06-05 ENCOUNTER — Ambulatory Visit: Admitting: Family

## 2023-06-05 ENCOUNTER — Ambulatory Visit: Payer: Self-pay | Admitting: Family

## 2023-06-05 ENCOUNTER — Encounter: Payer: Self-pay | Admitting: Family Medicine

## 2023-06-05 ENCOUNTER — Encounter: Payer: Self-pay | Admitting: Family

## 2023-06-05 ENCOUNTER — Other Ambulatory Visit: Payer: Self-pay | Admitting: Family Medicine

## 2023-06-05 ENCOUNTER — Ambulatory Visit (INDEPENDENT_AMBULATORY_CARE_PROVIDER_SITE_OTHER)

## 2023-06-05 VITALS — BP 122/75 | HR 78 | Ht 68.0 in | Wt 208.0 lb

## 2023-06-05 DIAGNOSIS — M4319 Spondylolisthesis, multiple sites in spine: Secondary | ICD-10-CM | POA: Diagnosis not present

## 2023-06-05 DIAGNOSIS — G8929 Other chronic pain: Secondary | ICD-10-CM | POA: Diagnosis not present

## 2023-06-05 DIAGNOSIS — M545 Low back pain, unspecified: Secondary | ICD-10-CM

## 2023-06-05 DIAGNOSIS — M48061 Spinal stenosis, lumbar region without neurogenic claudication: Secondary | ICD-10-CM | POA: Diagnosis not present

## 2023-06-05 DIAGNOSIS — M5117 Intervertebral disc disorders with radiculopathy, lumbosacral region: Secondary | ICD-10-CM | POA: Diagnosis not present

## 2023-06-05 DIAGNOSIS — M5116 Intervertebral disc disorders with radiculopathy, lumbar region: Secondary | ICD-10-CM | POA: Diagnosis not present

## 2023-06-05 DIAGNOSIS — M4316 Spondylolisthesis, lumbar region: Secondary | ICD-10-CM

## 2023-06-05 MED ORDER — KETOROLAC TROMETHAMINE 60 MG/2ML IM SOLN
60.0000 mg | Freq: Once | INTRAMUSCULAR | Status: AC
  Administered 2023-06-05: 60 mg via INTRAMUSCULAR

## 2023-06-05 MED ORDER — METHYLPREDNISOLONE ACETATE 80 MG/ML IJ SUSP
80.0000 mg | Freq: Once | INTRAMUSCULAR | Status: AC
  Administered 2023-06-05: 80 mg via INTRAMUSCULAR

## 2023-06-05 MED ORDER — DICLOFENAC SODIUM 75 MG PO TBEC: 75.0000 mg | DELAYED_RELEASE_TABLET | Freq: Two times a day (BID) | ORAL | 0 refills | Status: DC

## 2023-06-05 MED ORDER — BACLOFEN 10 MG PO TABS: 10.0000 mg | ORAL_TABLET | Freq: Three times a day (TID) | ORAL | 0 refills | Status: DC

## 2023-06-05 NOTE — Patient Instructions (Signed)
 Acute Back Pain, Adult Acute back pain is sudden and usually short-lived. It is often caused by an injury to the muscles and tissues in the back. The injury may result from: A muscle, tendon, or ligament getting overstretched or torn. Ligaments are tissues that connect bones to each other. Lifting something improperly can cause a back strain. Wear and tear (degeneration) of the spinal disks. Spinal disks are circular tissue that provide cushioning between the bones of the spine (vertebrae). Twisting motions, such as while playing sports or doing yard work. A hit to the back. Arthritis. You may have a physical exam, lab tests, and imaging tests to find the cause of your pain. Acute back pain usually goes away with rest and home care. Follow these instructions at home: Managing pain, stiffness, and swelling Take over-the-counter and prescription medicines only as told by your health care provider. Treatment may include medicines for pain and inflammation that are taken by mouth or applied to the skin, or muscle relaxants. Your health care provider may recommend applying ice during the first 24-48 hours after your pain starts. To do this: Put ice in a plastic bag. Place a towel between your skin and the bag. Leave the ice on for 20 minutes, 2-3 times a day. Remove the ice if your skin turns bright red. This is very important. If you cannot feel pain, heat, or cold, you have a greater risk of damage to the area. If directed, apply heat to the affected area as often as told by your health care provider. Use the heat source that your health care provider recommends, such as a moist heat pack or a heating pad. Place a towel between your skin and the heat source. Leave the heat on for 20-30 minutes. Remove the heat if your skin turns bright red. This is especially important if you are unable to feel pain, heat, or cold. You have a greater risk of getting burned. Activity  Do not stay in bed. Staying in  bed for more than 1-2 days can delay your recovery. Sit up and stand up straight. Avoid leaning forward when you sit or hunching over when you stand. If you work at a desk, sit close to it so you do not need to lean over. Keep your chin tucked in. Keep your neck drawn back, and keep your elbows bent at a 90-degree angle (right angle). Sit high and close to the steering wheel when you drive. Add lower back (lumbar) support to your car seat, if needed. Take short walks on even surfaces as soon as you are able. Try to increase the length of time you walk each day. Do not sit, drive, or stand in one place for more than 30 minutes at a time. Sitting or standing for long periods of time can put stress on your back. Do not drive or use heavy machinery while taking prescription pain medicine. Use proper lifting techniques. When you bend and lift, use positions that put less stress on your back: [place] your knees. Keep the load close to your body. Avoid twisting. Exercise regularly as told by your health care provider. Exercising helps your back heal faster and helps prevent back injuries by keeping muscles strong and flexible. Work with a physical therapist to make a safe exercise program, as recommended by your health care provider. Do any exercises as told by your physical therapist. Lifestyle Maintain a healthy weight. Extra weight puts stress on your back and makes it difficult to have good  posture. Avoid activities or situations that make you feel anxious or stressed. Stress and anxiety increase muscle tension and can make back pain worse. Learn ways to manage anxiety and stress, such as through exercise. General instructions Sleep on a firm mattress in a comfortable position. Try lying on your side with your knees slightly bent. If you lie on your back, put a pillow under your knees. Keep your head and neck in a straight line with your spine (neutral position) when using electronic equipment like  smartphones or pads. To do this: Raise your smartphone or pad to look at it instead of bending your head or neck to look down. Put the smartphone or pad at the level of your face while looking at the screen. Follow your treatment plan as told by your health care provider. This may include: Cognitive or behavioral therapy. Acupuncture or massage therapy. Meditation or yoga. Contact a health care provider if: You have pain that is not relieved with rest or medicine. You have increasing pain going down into your legs or buttocks. Your pain does not improve after 2 weeks. You have pain at night. You lose weight without trying. You have a fever or chills. You develop nausea or vomiting. You develop abdominal pain. Get help right away if: You develop new bowel or bladder control problems. You have unusual weakness or numbness in your arms or legs. You feel faint. These symptoms may represent a serious problem that is an emergency. Do not wait to see if the symptoms will go away. Get medical help right away. Call your local emergency services (911 in the [place] Do not drive yourself to the hospital. Summary Acute back pain is sudden and usually short-lived. Use proper lifting techniques. When you bend and lift, use positions that put less stress on your back. Take over-the-counter and prescription medicines only as told by your health care provider, and apply heat or ice as told. This information is not intended to replace advice given to you by your health care provider. Make sure you discuss any questions you have with your health care provider. Document Revised: 03/19/2020 Document Reviewed: 03/19/2020 Elsevier Patient Education  2024 [employer].

## 2023-06-05 NOTE — Progress Notes (Signed)
 Subjective:    Patient ID: Bryan Wilson, male    DOB: 1980-12-07, 43 y.o.   MRN: [id number]  Chief Complaint  Patient presents with   Back Pain    Lower. Chronic pain but flares on and off. Constant pain.    Back Pain This is a new problem. The current episode started 1 to 4 weeks ago. The problem occurs intermittently. The problem has been waxing and waning since onset. The pain is present in the lumbar spine. The quality of the pain is described as aching. The pain does not radiate. The pain is at a severity of 7/10. The pain is moderate. The symptoms are aggravated by bending and twisting. Pertinent negatives include no bladder incontinence, bowel incontinence, dysuria, numbness or pelvic pain. He has tried bed rest and NSAIDs for the symptoms. The treatment provided mild relief.      Review of Systems  Gastrointestinal:  Negative for bowel incontinence.  Genitourinary:  Negative for bladder incontinence, dysuria and pelvic pain.  Musculoskeletal:  Positive for back pain.  Neurological:  Negative for numbness.  All other systems reviewed and are negative.   Social History   Socioeconomic History   Marital status: Married    Spouse name: Bryan Wilson   Number of children: 2   Years of education: Not on file   Highest education level: Not on file  Occupational History   Not on file  Tobacco Use   Smoking status: Never   Smokeless tobacco: Never  Substance and Sexual Activity   Alcohol use: Yes    Alcohol/week: 12.0 standard drinks of alcohol    Types: 12 Cans of beer per week   Drug use: No   Sexual activity: Yes    Birth control/protection: Surgical  Other Topics Concern   Not on file  Social History Narrative   Works at [employer], married to a nurse Bryan Wilson   Enjoys hunting   Social Drivers of Corporate investment banker Strain: Not on file  Food Insecurity: Not on file  Transportation Needs: Not on file  Physical Activity: Not on file  Stress: Not on  file  Social Connections: Not on file   Family History  Problem Relation Age of Onset   Kidney Stones Father    Hyperlipidemia Father    Heart disease Father    Heart attack Maternal Grandfather 60   Alcohol abuse Neg Hx    Asthma Neg Hx    Hypertension Neg Hx    Cancer Neg Hx         Objective:   Physical Exam Vitals reviewed.  Constitutional:      General: He is not in acute distress.    Appearance: He is well-developed.  HENT:     Head: Normocephalic.     Right Ear: Tympanic membrane and external ear normal.     Left Ear: Tympanic membrane and external ear normal.  Eyes:     General:        Right eye: No discharge.        Left eye: No discharge.     Pupils: Pupils are equal, round, and reactive to light.  Neck:     Thyroid : No thyromegaly.  Cardiovascular:     Rate and Rhythm: Normal rate and regular rhythm.     Heart sounds: Normal heart sounds. No murmur heard. Pulmonary:     Effort: Pulmonary effort is normal. No respiratory distress.     Breath sounds: Normal breath sounds. No  wheezing.  Abdominal:     General: Bowel sounds are normal. There is no distension.     Palpations: Abdomen is soft.     Tenderness: There is no abdominal tenderness.  Musculoskeletal:        General: No tenderness. Normal range of motion.     Cervical back: Normal range of motion and neck supple.     Comments: Pain in lumbar with flexion, full ROM  Skin:    General: Skin is warm and dry.     Findings: No erythema or rash.  Neurological:     Mental Status: He is alert and oriented to person, place, and time.     Cranial Nerves: No cranial nerve deficit.     Deep Tendon Reflexes: Reflexes are normal and symmetric.  Psychiatric:        Behavior: Behavior normal.        Thought Content: Thought content normal.        Judgment: Judgment normal.       BP 122/75   Pulse 78   Ht 5\' 8"  (1.727 m)   Wt 208 lb (94.3 kg)   SpO2 97%   BMI 31.63 kg/m      Assessment & Plan:   Bryan Wilson comes in today with chief complaint of Back Pain (Lower. Chronic pain but flares on and off. Constant pain.)   Diagnosis and orders addressed:  1. Acute midline low back pain without sciatica (Primary) Rest Ice ROM exercises No other NSAID's while taking diclofenac  BID with food, take 3-5 days Sedation precautions discussed with baclofen  Follow up if symptoms worsen or do not improve  - DG Lumbar Spine 2-3 Views; Future - ketorolac  (TORADOL ) injection 60 mg - methylPREDNISolone  acetate (DEPO-MEDROL ) injection 80 mg - baclofen (LIORESAL) 10 MG tablet; Take 1 tablet (10 mg total) by mouth 3 (three) times daily.  Dispense: 30 each; Refill: 0 - diclofenac  (VOLTAREN ) 75 MG EC tablet; Take 1 tablet (75 mg total) by mouth 2 (two) times daily.  Dispense: 60 tablet; Refill: 0     Bryan Fragmin, FNP

## 2023-07-16 IMAGING — DX DG FINGER LITTLE 2+V*R*
3 series · 3 of 3 positions shown · non-contrast
Comparison: None.

CLINICAL DATA: Crush injury

EXAM:
RIGHT LITTLE FINGER 2+V

[finger ap]
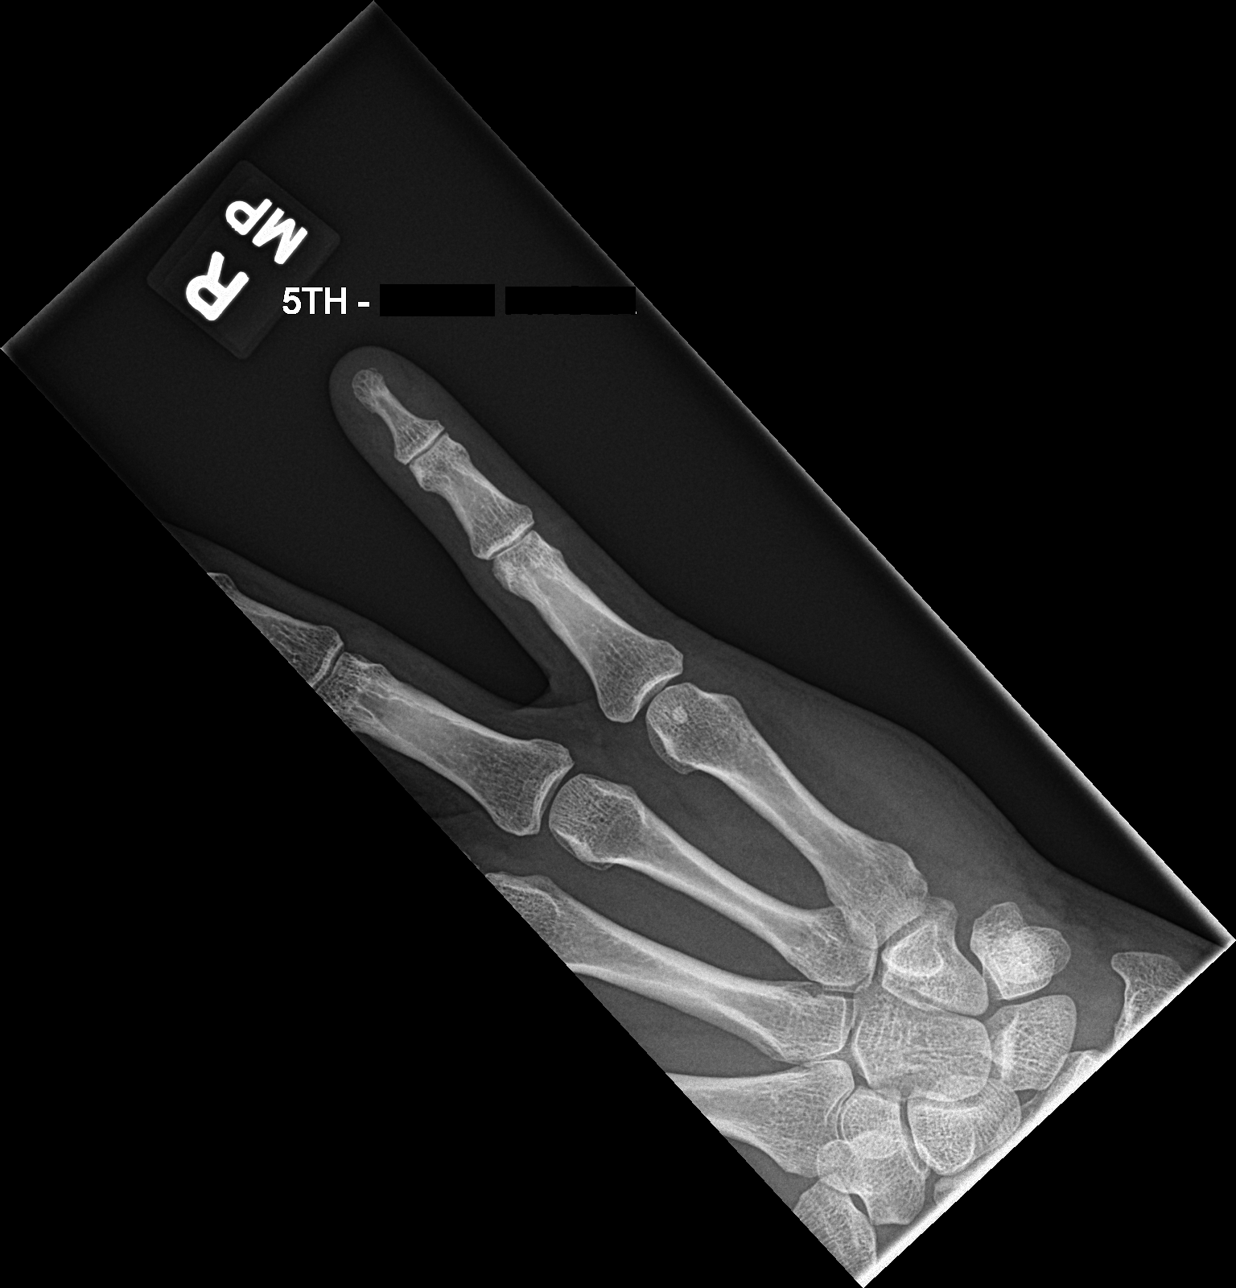

[finger obl]
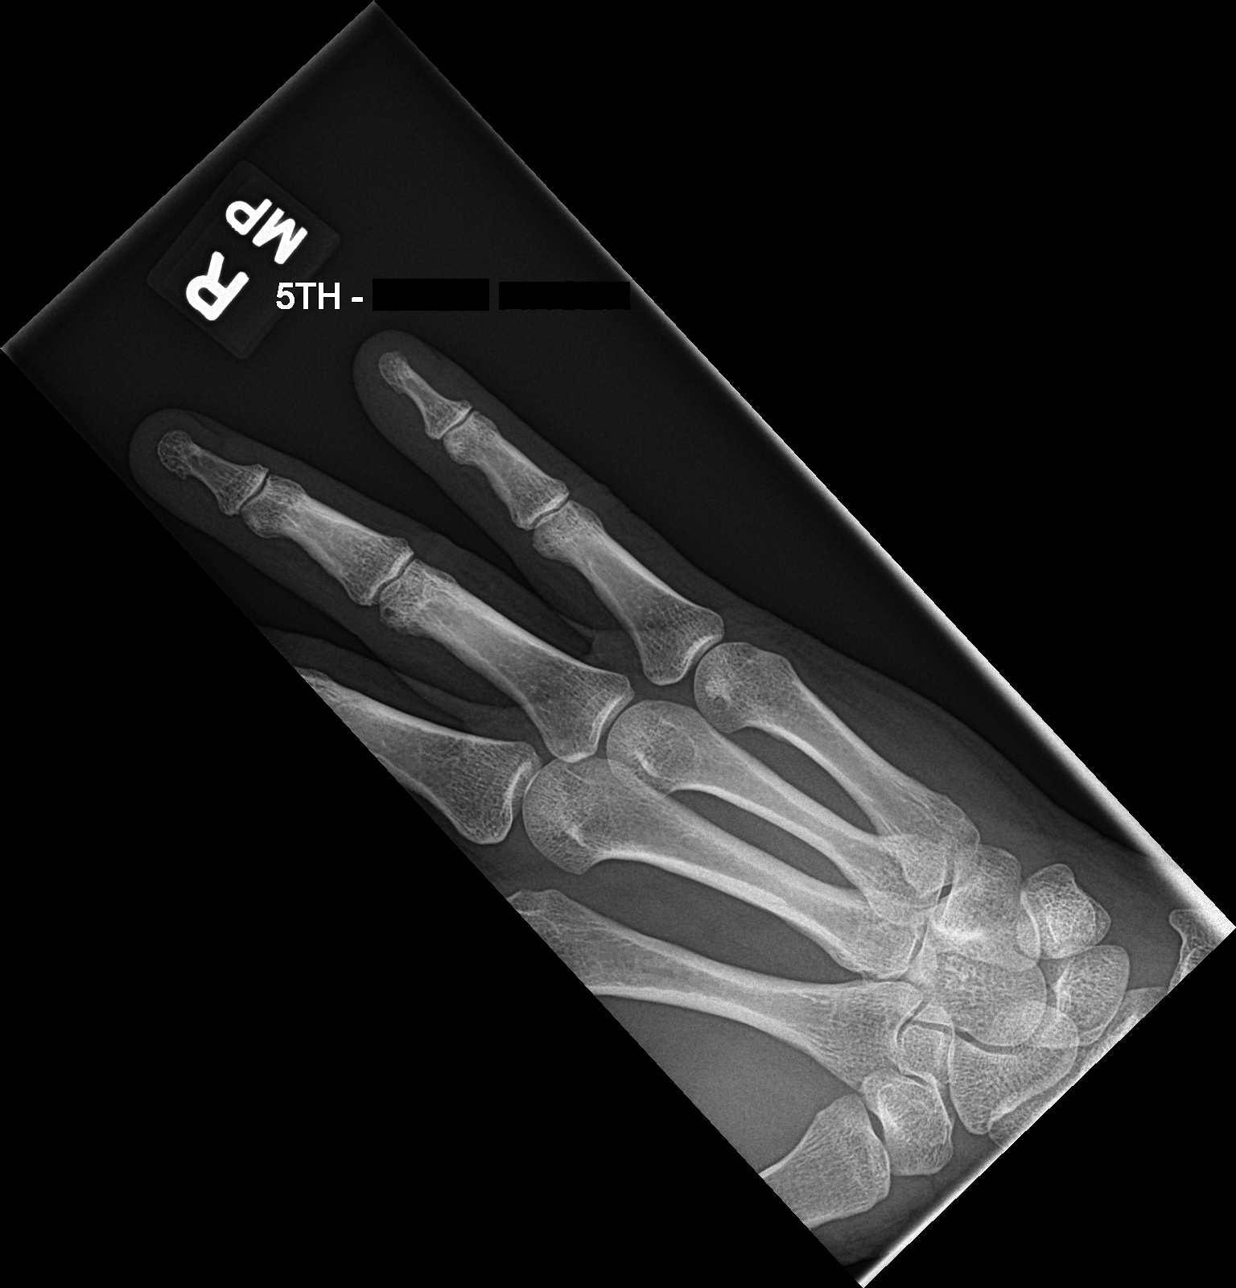

[finger lat]
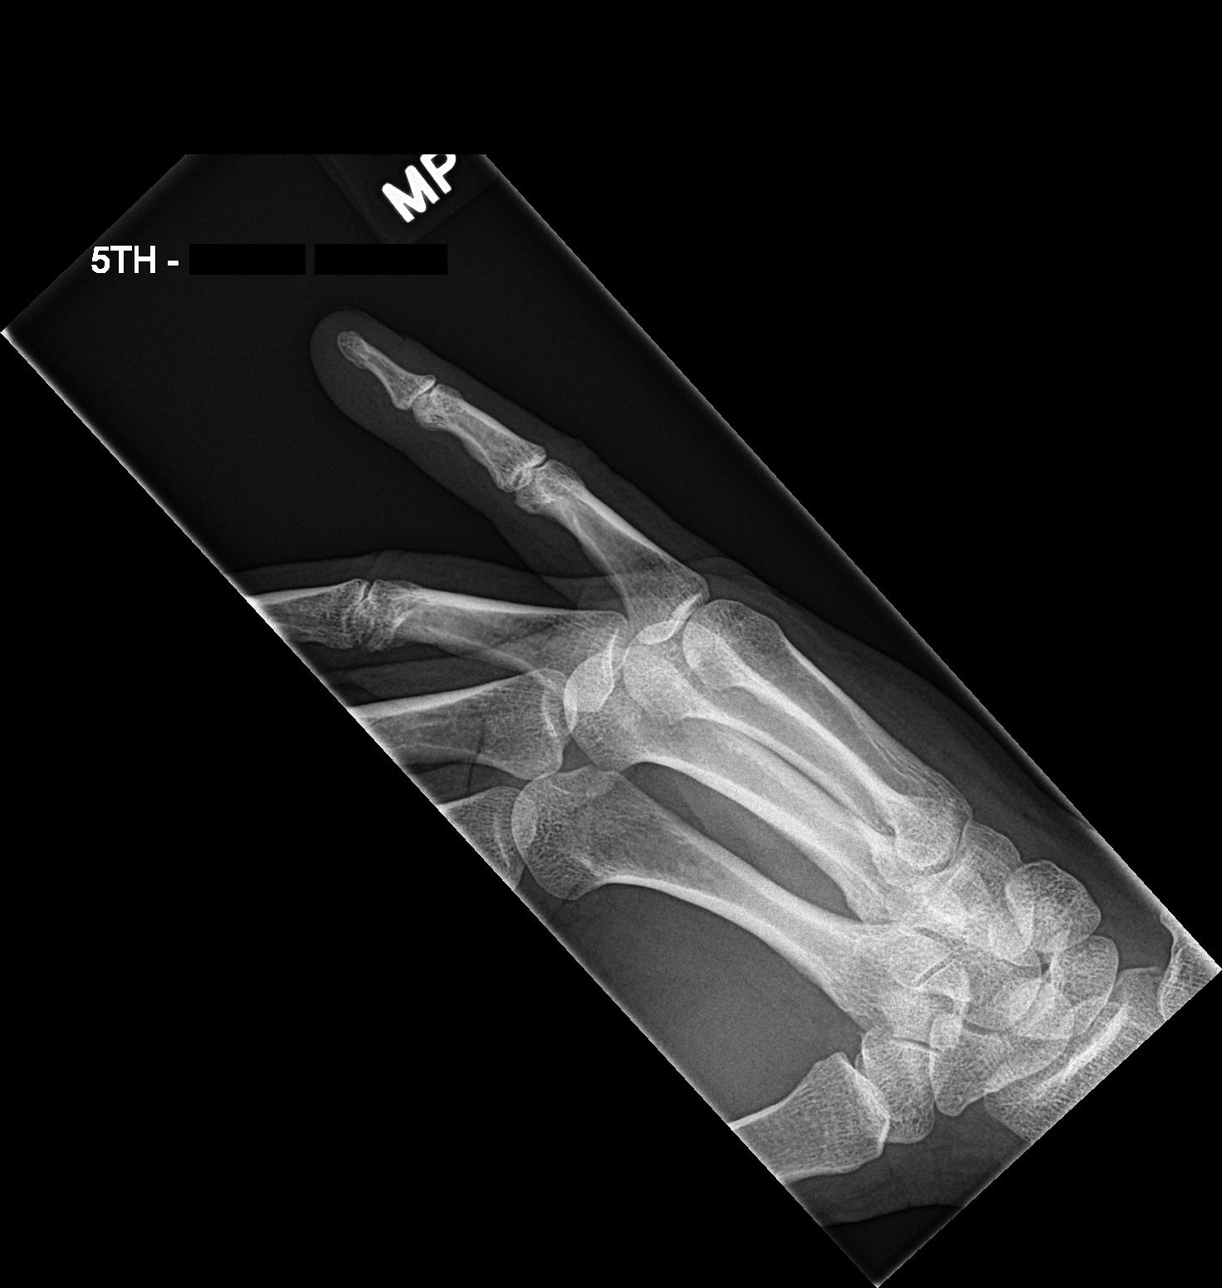

[3 of 3 positions shown; findings below may reference images not displayed]

FINDINGS: Tiny acute fracture fragment identified at the tuft of the fifth
distal phalanx. Joint spaces are preserved.
IMPRESSION: As above.

## 2023-07-17 ENCOUNTER — Other Ambulatory Visit: Payer: Self-pay | Admitting: Family Medicine

## 2023-07-17 ENCOUNTER — Encounter: Payer: Self-pay | Admitting: Family Medicine

## 2023-07-17 DIAGNOSIS — K200 Eosinophilic esophagitis: Secondary | ICD-10-CM

## 2023-07-17 NOTE — Telephone Encounter (Signed)
 Letter mailed

## 2023-07-17 NOTE — Telephone Encounter (Signed)
 [facility] NT[facility] last chronic FU 02/28/22 NO RF sent to pharmacy last OV greater than a year

## 2023-07-18 ENCOUNTER — Encounter: Payer: Self-pay | Admitting: Family Medicine

## 2023-07-18 DIAGNOSIS — K200 Eosinophilic esophagitis: Secondary | ICD-10-CM

## 2023-07-18 MED ORDER — OMEPRAZOLE 40 MG PO CPDR: 40.0000 mg | DELAYED_RELEASE_CAPSULE | Freq: Every day | ORAL | 3 refills | Status: AC

## 2023-08-09 ENCOUNTER — Other Ambulatory Visit: Payer: Self-pay | Admitting: Family Medicine

## 2023-08-09 DIAGNOSIS — Z8249 Family history of ischemic heart disease and other diseases of the circulatory system: Secondary | ICD-10-CM

## 2023-08-09 DIAGNOSIS — E7800 Pure hypercholesterolemia, unspecified: Secondary | ICD-10-CM

## 2023-08-09 NOTE — Telephone Encounter (Signed)
 Bryan Wilson is going to talk to Dr Jolinda about getting appt

## 2023-08-09 NOTE — Telephone Encounter (Signed)
 [facility] NT[facility] last PE/chronic FU 02/28/22 NO RF sent to pharmacy last OV greater than a year

## 2023-08-10 ENCOUNTER — Other Ambulatory Visit: Payer: Self-pay | Admitting: Family Medicine

## 2023-08-10 ENCOUNTER — Other Ambulatory Visit (HOSPITAL_COMMUNITY): Payer: Self-pay

## 2023-08-10 ENCOUNTER — Other Ambulatory Visit: Payer: Self-pay

## 2023-08-10 DIAGNOSIS — E7800 Pure hypercholesterolemia, unspecified: Secondary | ICD-10-CM

## 2023-08-10 DIAGNOSIS — Z8249 Family history of ischemic heart disease and other diseases of the circulatory system: Secondary | ICD-10-CM

## 2023-08-10 MED ORDER — ROSUVASTATIN CALCIUM 10 MG PO TABS
10.0000 mg | ORAL_TABLET | Freq: Every day | ORAL | 0 refills | Status: DC
Start: 2023-08-10 — End: 2023-09-28
  Filled 2023-08-10: qty 90, 90d supply, fill #0

## 2023-09-28 ENCOUNTER — Encounter: Payer: Self-pay | Admitting: Family Medicine

## 2023-09-28 ENCOUNTER — Ambulatory Visit (INDEPENDENT_AMBULATORY_CARE_PROVIDER_SITE_OTHER): Admitting: Family Medicine

## 2023-09-28 VITALS — BP 120/81 | HR 83 | Temp 96.8°F | Ht 69.0 in | Wt 208.0 lb

## 2023-09-28 DIAGNOSIS — E66811 Obesity, class 1: Secondary | ICD-10-CM

## 2023-09-28 DIAGNOSIS — Z8249 Family history of ischemic heart disease and other diseases of the circulatory system: Secondary | ICD-10-CM | POA: Diagnosis not present

## 2023-09-28 DIAGNOSIS — Z0000 Encounter for general adult medical examination without abnormal findings: Secondary | ICD-10-CM

## 2023-09-28 DIAGNOSIS — Z2821 Immunization not carried out because of patient refusal: Secondary | ICD-10-CM

## 2023-09-28 DIAGNOSIS — Z0001 Encounter for general adult medical examination with abnormal findings: Secondary | ICD-10-CM | POA: Diagnosis not present

## 2023-09-28 DIAGNOSIS — Z125 Encounter for screening for malignant neoplasm of prostate: Secondary | ICD-10-CM

## 2023-09-28 DIAGNOSIS — F418 Other specified anxiety disorders: Secondary | ICD-10-CM

## 2023-09-28 DIAGNOSIS — K200 Eosinophilic esophagitis: Secondary | ICD-10-CM

## 2023-09-28 DIAGNOSIS — E7800 Pure hypercholesterolemia, unspecified: Secondary | ICD-10-CM

## 2023-09-28 MED ORDER — HYDROXYZINE HCL 25 MG PO TABS: 12.5000 mg | ORAL_TABLET | Freq: Three times a day (TID) | ORAL | 0 refills | Status: DC | PRN

## 2023-09-28 MED ORDER — ROSUVASTATIN CALCIUM 10 MG PO TABS
10.0000 mg | ORAL_TABLET | Freq: Every day | ORAL | 3 refills | Status: AC
  Filled 2023-11-14: qty 90, 90d supply, fill #0
  Filled 2023-12-30 – 2024-01-22 (×2): qty 90, 90d supply, fill #1

## 2023-09-28 NOTE — Progress Notes (Signed)
 Bryan Wilson is a 43 y.o. male presents to office today for annual physical exam examination.   History of Present Illness   He reports that he is doing fairly well except for some stress at work.  He uses Atarax  as needed sleep and only uses this occasionally.  He has been really trying to modify diet in efforts to promote health.  He does not smoke, use any illicit substances.  He does drink on average 1-2 beers per day but most the time consumption of alcohol is only on the weekend.  He was promoted since her last visit to supervising physician.  He is compliant with statin medication.  Reports no abdominal pain, new muscle pain, chest pain or shortness of breath.  He is not fasting today.  He has decreased use of omeprazole  because it seems to irritate his stomach more than help him since manufacture was changed on it.  He has cut down on monster drinks  His back is doing a lot better and he has continued weekly chiropractic sessions and home physical therapies.  Occupation: Merchandiser, retail at [employer], Marital status: married, Substance use: ETOH <2 beers on avg per day  There are no preventive care reminders to display for this patient. Refills needed today: all  Immunization History  Administered Date(s) Administered   Influenza,inj,Quad PF,6+ Mos 10/18/2012   Influenza-Unspecified 11/17/2013, 11/13/2014, 11/22/2015, 11/13/2016   Moderna Sars-Covid-2 Vaccination 10/15/2019, 11/12/2019   Td 04/07/2021   Tdap 10/22/2013   Past Medical History:  Diagnosis Date   Allergy    Anxiety    Depression    Nephrolithiasis 06/2013   Social History   Socioeconomic History   Marital status: Married    Spouse name: Rosina   Number of children: 2   Years of education: Not on file   Highest education level: Not on file  Occupational History   Not on file  Tobacco Use   Smoking status: Never   Smokeless tobacco: Never  Substance and Sexual Activity   Alcohol use: Yes     Alcohol/week: 12.0 standard drinks of alcohol    Types: 12 Cans of beer per week   Drug use: No   Sexual activity: Yes    Birth control/protection: Surgical  Other Topics Concern   Not on file  Social History Narrative   Works at [employer], married to a Tour manager   Enjoys hunting   Social Drivers of Corporate investment banker Strain: Not on file  Food Insecurity: Not on file  Transportation Needs: Not on file  Physical Activity: Not on file  Stress: Not on file  Social Connections: Not on file  Intimate Partner Violence: Not on file   Past Surgical History:  Procedure Laterality Date   VASECTOMY     Family History  Problem Relation Age of Onset   Kidney Stones Father    Hyperlipidemia Father    Heart disease Father    Heart attack Maternal Grandfather 23   Alcohol abuse Neg Hx    Asthma Neg Hx    Hypertension Neg Hx    Cancer Neg Hx     Current Outpatient Medications:    fexofenadine  (ALLEGRA ) 180 MG tablet, Take 1 tablet (180 mg total) by mouth daily. If ins does not cover, he will get OTC if cheaper., Disp: 90 tablet, Rfl: 3   hydrOXYzine  (ATARAX ) 25 MG tablet, Take 0.5-2 tablets (12.5-50 mg total) by mouth every 8 (eight) hours as needed for anxiety.,  Disp: 90 tablet, Rfl: 0   omeprazole  (PRILOSEC) 40 MG capsule, Take 1 capsule (40 mg total) by mouth daily., Disp: 90 capsule, Rfl: 3   baclofen  (LIORESAL ) 10 MG tablet, Take 1 tablet (10 mg total) by mouth 3 (three) times daily. (Patient not taking: Reported on 09/28/2023), Disp: 30 each, Rfl: 0   diclofenac  (VOLTAREN ) 75 MG EC tablet, Take 1 tablet (75 mg total) by mouth 2 (two) times daily. (Patient not taking: Reported on 09/28/2023), Disp: 60 tablet, Rfl: 0   rosuvastatin  (CRESTOR ) 10 MG tablet, Take 1 tablet (10 mg total) by mouth daily., Disp: 90 tablet, Rfl: 3  Allergies  Allergen Reactions   Tramadol Nausea And Vomiting   Shellfish Allergy Itching and Rash     ROS: Review of Systems Pertinent  items noted in HPI and remainder of comprehensive ROS otherwise negative.    Physical exam BP 120/81   Pulse 83   Temp (!) 96.8 F (36 C)   Ht 5' 9 (1.753 m)   Wt 208 lb (94.3 kg)   SpO2 97%   BMI 30.72 kg/m  General appearance: alert, cooperative, appears stated age, no distress, and mildly obese Head: Normocephalic, without obvious abnormality, atraumatic Eyes: negative findings: lids and lashes normal, conjunctivae and sclerae normal, corneas clear, and pupils equal, round, reactive to light and accomodation Ears: normal TM's and external ear canals both ears Nose: Nares normal. Septum midline. Mucosa normal. No drainage or sinus tenderness. Throat: lips, mucosa, and tongue normal; teeth and gums normal Neck: no adenopathy, no carotid bruit, supple, symmetrical, trachea midline, and thyroid  not enlarged, symmetric, no tenderness/mass/nodules Back: symmetric, no curvature. ROM normal. No CVA tenderness. Lungs: clear to auscultation bilaterally Chest wall: no tenderness Heart: regular rate and rhythm, S1, S2 normal, no murmur, click, rub or gallop Abdomen: soft, non-tender; bowel sounds normal; no masses,  no organomegaly Extremities: extremities normal, atraumatic, no cyanosis or edema Pulses: 2+ and symmetric Skin: Skin color, texture, turgor normal. No rashes or lesions Lymph nodes: Cervical, supraclavicular, and axillary nodes normal. Neurologic: Grossly normal      09/28/2023    3:34 PM 06/05/2023    9:16 AM 02/28/2022    2:17 PM  Depression screen PHQ 2/9  Decreased Interest 0 0 0  Down, Depressed, Hopeless 0 0 0  PHQ - 2 Score 0 0 0  Altered sleeping 0 0 0  Tired, decreased energy 0 0 1  Change in appetite 0 0 0  Feeling bad or failure about yourself  0 0 0  Trouble concentrating 0 0 0  Moving slowly or fidgety/restless 0 0 0  Suicidal thoughts 0 0 0  PHQ-9 Score 0 0 1  Difficult doing work/chores Not difficult at all Not difficult at all Not difficult at all       09/28/2023    3:34 PM 06/05/2023    9:16 AM 12/08/2020    2:35 PM 03/09/2015    9:19 AM  GAD 7 : Generalized Anxiety Score  Nervous, Anxious, on Edge 0 0 0 0  Control/stop worrying 0 0 0 0  Worry too much - different things 0 0 0 0  Trouble relaxing 0 0 0 0  Restless 0 0 0 0  Easily annoyed or irritable 0 0 0 1  Afraid - awful might happen 0 0 0 0  Total GAD 7 Score 0 0 0 1  Anxiety Difficulty Not difficult at all Not difficult at all Not difficult at all Not difficult at all  Assessment/ Plan: Donnice MARLA Potters here for annual physical exam.  Assessment & Plan   Annual physical exam  Pure hypercholesterolemia - Plan: CMP14+EGFR, Lipid Panel, rosuvastatin  (CRESTOR ) 10 MG tablet, Lipoprotein A (LPA)  Family history of chronic ischemic heart disease - Plan: CMP14+EGFR, Lipid Panel, rosuvastatin  (CRESTOR ) 10 MG tablet, Lipoprotein A (LPA)  Obesity (BMI 30.0-34.9) - Plan: Bayer DCA Hb A1c Waived  Eosinophilic esophagitis - Plan: CBC with Differential  Screening for malignant neoplasm of prostate - Plan: PSA  Vaccination refused by patient  Fasting labs ordered I have also placed order for lipoprotein a given known ischemic heart disease in his father.  He will continue statin as directed.  Continue to modify lifestyle in efforts to reduce weight as he is technically in class I obesity.  Future order for CBC given chronic PPI use and known esophagitis.  Asymptomatic from a prostate standpoint.  PSA ordered  He is up-to-date on preventive healthcare except for vaccinations but he declines   Counseled on healthy lifestyle choices, including diet (rich in fruits, vegetables and lean meats and low in salt and simple carbohydrates) and exercise (at least 30 minutes of moderate physical activity daily).  Patient to follow up 1 year for CPE  Kylen Ismael M. Jolinda, DO

## 2023-10-09 ENCOUNTER — Other Ambulatory Visit: Payer: Self-pay

## 2023-10-30 ENCOUNTER — Other Ambulatory Visit

## 2023-10-30 DIAGNOSIS — Z8249 Family history of ischemic heart disease and other diseases of the circulatory system: Secondary | ICD-10-CM

## 2023-10-30 DIAGNOSIS — E66811 Obesity, class 1: Secondary | ICD-10-CM

## 2023-10-30 DIAGNOSIS — Z125 Encounter for screening for malignant neoplasm of prostate: Secondary | ICD-10-CM | POA: Diagnosis not present

## 2023-10-30 DIAGNOSIS — K200 Eosinophilic esophagitis: Secondary | ICD-10-CM | POA: Diagnosis not present

## 2023-10-30 DIAGNOSIS — E7800 Pure hypercholesterolemia, unspecified: Secondary | ICD-10-CM | POA: Diagnosis not present

## 2023-10-30 LAB — BAYER DCA HB A1C WAIVED: HB A1C (BAYER DCA - WAIVED): 4.1 % — ABNORMAL LOW (ref 4.8–5.6)

## 2023-10-31 ENCOUNTER — Ambulatory Visit: Payer: Self-pay | Admitting: Family Medicine

## 2023-10-31 LAB — CBC WITH DIFFERENTIAL/PLATELET
Basophils Absolute: 0 x10E3/uL (ref 0.0–0.2)
Basos: 1 %
EOS (ABSOLUTE): 0.2 x10E3/uL (ref 0.0–0.4)
Eos: 4 %
Hematocrit: 42.6 % (ref 37.5–51.0)
Hemoglobin: 13.9 g/dL (ref 13.0–17.7)
Immature Grans (Abs): 0 x10E3/uL (ref 0.0–0.1)
Immature Granulocytes: 0 %
Lymphocytes Absolute: 1.4 x10E3/uL (ref 0.7–3.1)
Lymphs: 37 %
MCH: 29.4 pg (ref 26.6–33.0)
MCHC: 32.6 g/dL (ref 31.5–35.7)
MCV: 90 fL (ref 79–97)
Monocytes Absolute: 0.3 x10E3/uL (ref 0.1–0.9)
Monocytes: 7 %
Neutrophils Absolute: 2 x10E3/uL (ref 1.4–7.0)
Neutrophils: 51 %
Platelets: 203 x10E3/uL (ref 150–450)
RBC: 4.73 x10E6/uL (ref 4.14–5.80)
RDW: 12.8 % (ref 11.6–15.4)
WBC: 3.9 x10E3/uL (ref 3.4–10.8)

## 2023-10-31 LAB — CMP14+EGFR
ALT: 28 IU/L (ref 0–44)
AST: 28 IU/L (ref 0–40)
Albumin: 4.6 g/dL (ref 4.1–5.1)
Alkaline Phosphatase: 67 IU/L (ref 47–123)
BUN/Creatinine Ratio: 9 (ref 9–20)
BUN: 9 mg/dL (ref 6–24)
Bilirubin Total: 0.7 mg/dL (ref 0.0–1.2)
CO2: 23 mmol/L (ref 20–29)
Calcium: 9.4 mg/dL (ref 8.7–10.2)
Chloride: 104 mmol/L (ref 96–106)
Creatinine, Ser: 0.99 mg/dL (ref 0.76–1.27)
Globulin, Total: 2.1 g/dL (ref 1.5–4.5)
Glucose: 83 mg/dL (ref 70–99)
Potassium: 3.9 mmol/L (ref 3.5–5.2)
Sodium: 142 mmol/L (ref 134–144)
Total Protein: 6.7 g/dL (ref 6.0–8.5)
eGFR: 97 mL/min/1.73 (ref >59)

## 2023-10-31 LAB — LIPID PANEL
Chol/HDL Ratio: 4.2 ratio (ref 0.0–5.0)
Cholesterol, Total: 169 mg/dL (ref 100–199)
HDL: 40 mg/dL (ref >39)
LDL Chol Calc (NIH): 104 mg/dL — ABNORMAL HIGH (ref 0–99)
Triglycerides: 139 mg/dL (ref 0–149)
VLDL Cholesterol Cal: 25 mg/dL (ref 5–40)

## 2023-10-31 LAB — PSA: Prostate Specific Ag, Serum: 1.1 ng/mL (ref 0.0–4.0)

## 2023-10-31 LAB — LIPOPROTEIN A (LPA): Lipoprotein (a): 39.9 nmol/L (ref <75.0)

## 2023-11-08 ENCOUNTER — Other Ambulatory Visit: Payer: Self-pay | Admitting: Family Medicine

## 2023-11-08 ENCOUNTER — Other Ambulatory Visit (HOSPITAL_COMMUNITY): Payer: Self-pay

## 2023-11-08 DIAGNOSIS — Z8249 Family history of ischemic heart disease and other diseases of the circulatory system: Secondary | ICD-10-CM

## 2023-11-08 DIAGNOSIS — E7800 Pure hypercholesterolemia, unspecified: Secondary | ICD-10-CM

## 2023-11-14 ENCOUNTER — Other Ambulatory Visit: Payer: Self-pay

## 2023-11-14 ENCOUNTER — Other Ambulatory Visit (HOSPITAL_COMMUNITY): Payer: Self-pay

## 2023-11-23 ENCOUNTER — Other Ambulatory Visit (HOSPITAL_COMMUNITY): Payer: Self-pay

## 2023-12-31 ENCOUNTER — Other Ambulatory Visit (HOSPITAL_COMMUNITY): Payer: Self-pay

## 2024-01-23 ENCOUNTER — Encounter: Payer: Self-pay | Admitting: Family Medicine

## 2024-01-24 ENCOUNTER — Other Ambulatory Visit: Payer: Self-pay

## 2024-01-24 ENCOUNTER — Other Ambulatory Visit (HOSPITAL_COMMUNITY): Payer: Self-pay

## 2024-01-24 ENCOUNTER — Telehealth: Payer: Self-pay

## 2024-01-24 DIAGNOSIS — F418 Other specified anxiety disorders: Secondary | ICD-10-CM

## 2024-01-24 MED ORDER — HYDROXYZINE HCL 25 MG PO TABS
12.5000 mg | ORAL_TABLET | Freq: Three times a day (TID) | ORAL | 0 refills | Status: AC | PRN
  Filled 2024-01-24: qty 90, 15d supply, fill #0

## 2024-01-24 NOTE — Telephone Encounter (Signed)
 Verbal okay to send refill for Hydroxyzine  from covering provider Rosaline Bruns, NP.

## 2024-02-05 ENCOUNTER — Ambulatory Visit: Admitting: Gastroenterology

## 2024-02-15 ENCOUNTER — Telehealth: Admitting: Nurse Practitioner

## 2024-02-15 ENCOUNTER — Encounter: Payer: Self-pay | Admitting: Nurse Practitioner

## 2024-02-15 DIAGNOSIS — J0101 Acute recurrent maxillary sinusitis: Secondary | ICD-10-CM

## 2024-02-15 MED ORDER — AMOXICILLIN-POT CLAVULANATE 875-125 MG PO TABS: 1.0000 | ORAL_TABLET | Freq: Two times a day (BID) | ORAL | 0 refills | Status: AC

## 2024-02-15 NOTE — Progress Notes (Signed)
 "   Virtual Visit Consent   Bryan Wilson, you are scheduled for a virtual visit with Mary-Margaret Gladis, FNP, a [facility] provider, today.     Just as with appointments in the office, your consent must be obtained to participate.  Your consent will be active for this visit and any virtual visit you may have with one of our providers in the next 365 days.     If you have a MyChart account, a copy of this consent can be sent to you electronically.  All virtual visits are billed to your insurance company just like a traditional visit in the office.    As this is a virtual visit, video technology does not allow for your provider to perform a traditional examination.  This may limit your provider's ability to fully assess your condition.  If your provider identifies any concerns that need to be evaluated in person or the need to arrange testing (such as labs, EKG, etc.), we will make arrangements to do so.     Although advances in technology are sophisticated, we cannot ensure that it will always work on either your end or our end.  If the connection with a video visit is poor, the visit may have to be switched to a telephone visit.  With either a video or telephone visit, we are not always able to ensure that we have a secure connection.     I need to obtain your verbal consent now.   Are you willing to proceed with your visit today? YES   Bryan Wilson has provided verbal consent on 02/15/2024 for a virtual visit (video or telephone).   Mary-Margaret Gladis, FNP   Date: 02/15/2024 4:36 PM   Virtual Visit via Video Note   I, Mary-Margaret Gladis, connected with Bryan Wilson ([id number], 12/01/1980) on 02/15/24 at  5:00 PM EST by a video-enabled telemedicine application and verified that I am speaking with the correct person using two identifiers.  Location: Patient: Virtual Visit Location Patient: Home Provider: Virtual Visit Location Provider: Mobile   I discussed the limitations of  evaluation and management by telemedicine and the availability of in person appointments. The patient expressed understanding and agreed to proceed.    History of Present Illness: Bryan Wilson is a 44 y.o. who identifies as a male who was assigned male at birth, and is being seen today for sinusitis.  HPI: Sinusitis This is a new problem. The current episode started yesterday. The problem has been gradually worsening since onset. There has been no fever. Associated symptoms include congestion, coughing, sinus pressure and a sore throat. Past treatments include nothing. The treatment provided mild relief.    Review of Systems  HENT:  Positive for congestion, sinus pressure and sore throat.   Respiratory:  Positive for cough.     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  Resting comfortably  at home.  Head is normocephalic, atraumatic.  No labored breathing.  Speech is clear and coherent with logical content.  Patient is alert and oriented at baseline.  Maxillary sinus pressure bil  Assessment and Plan:  Bryan Wilson in today with chief complaint of sinusitis  1. Acute recurrent maxillary sinusitis (Primary) 1. Take meds as prescribed 2.  Use a cool mist humidifier especially during the winter months and when heat has been humid. 3. Use saline nose sprays frequently 4. Saline irrigations of the nose can be very helpful if done frequently.  * 4X daily for 1 week*  * Use of a nettie pot can be helpful with this. Follow directions with this* 5. Drink plenty of fluids 6. Keep thermostat turn down low 7.For any cough or congestion- mucinex 8. For fever or aces or pains- take tylenol  or ibuprofen  appropriate for age and weight.  * for fevers greater than 101 orally you may alternate ibuprofen and tylenol  every  3 hours.    - amoxicillin -clavulanate (AUGMENTIN ) 875-125 MG tablet; Take 1 tablet by mouth 2 (two) times daily.  Dispense: 14 tablet; Refill: 0    Follow Up Instructions: I discussed the assessment and treatment plan with the patient. The patient was provided an opportunity to ask questions and all were answered. The patient agreed with the plan and demonstrated an understanding of the instructions.  A copy of instructions were sent to the patient via MyChart.  The patient was advised to call back or seek an in-person evaluation if the symptoms worsen or if the condition fails to improve as anticipated.  Time:  I spent 8 minutes with the patient via telehealth technology discussing the above problems/concerns.    Mary-Margaret Gladis, FNP    [1]  Allergies Allergen Reactions   Tramadol Nausea And Vomiting   Shellfish Allergy Itching and Rash  [2]  Current Outpatient Medications:    fexofenadine  (ALLEGRA ) 180 MG tablet, Take 1 tablet (180 mg total) by mouth daily. If ins does not cover, he will get OTC if cheaper., Disp: 90 tablet, Rfl: 3   hydrOXYzine  (ATARAX ) 25 MG tablet, Take 0.5-2 tablets (12.5-50 mg total) by mouth every 8 (eight) hours as needed for anxiety., Disp: 90 tablet, Rfl: 0   omeprazole  (PRILOSEC) 40 MG capsule, Take 1 capsule (40 mg total) by mouth daily., Disp: 90 capsule, Rfl: 3   rosuvastatin  (CRESTOR ) 10 MG tablet, Take 1 tablet (10 mg total) by mouth daily., Disp: 90 tablet, Rfl: 3  "

## 2024-02-15 NOTE — Patient Instructions (Signed)
1. Take meds as prescribed 2. Use a cool mist humidifier especially during the winter months and when heat has been humid. 3. Use saline nose sprays frequently 4. Saline irrigations of the nose can be very helpful if done frequently.  * 4X daily for 1 week*  * Use of a nettie pot can be helpful with this. Follow directions with this* 5. Drink plenty of fluids 6. Keep thermostat turn down low 7.For any cough or congestion- mucinex OTC if needed 8. For fever or aces or pains- take tylenol or ibuprofen appropriate for age and weight.  * for fevers greater than 101 orally you may alternate ibuprofen and tylenol every  3 hours.    

## 2024-03-04 ENCOUNTER — Ambulatory Visit: Admitting: Gastroenterology
